# Patient Record
Sex: Female | Born: 1935 | Race: White | Hispanic: No | Smoking: Never smoker
Health system: Southern US, Community
[De-identification: ages and names within clinical notes are randomized; demographics above are authoritative.]

## PROBLEM LIST (undated history)

## (undated) DIAGNOSIS — F419 Anxiety disorder, unspecified: Secondary | ICD-10-CM

## (undated) DIAGNOSIS — I2510 Atherosclerotic heart disease of native coronary artery without angina pectoris: Secondary | ICD-10-CM

## (undated) DIAGNOSIS — E785 Hyperlipidemia, unspecified: Secondary | ICD-10-CM

## (undated) DIAGNOSIS — D649 Anemia, unspecified: Secondary | ICD-10-CM

## (undated) DIAGNOSIS — I447 Left bundle-branch block, unspecified: Secondary | ICD-10-CM

## (undated) DIAGNOSIS — R55 Syncope and collapse: Secondary | ICD-10-CM

## (undated) DIAGNOSIS — K5792 Diverticulitis of intestine, part unspecified, without perforation or abscess without bleeding: Secondary | ICD-10-CM

## (undated) DIAGNOSIS — F32A Depression, unspecified: Secondary | ICD-10-CM

## (undated) DIAGNOSIS — Z950 Presence of cardiac pacemaker: Secondary | ICD-10-CM

## (undated) DIAGNOSIS — I5181 Takotsubo syndrome: Secondary | ICD-10-CM

## (undated) DIAGNOSIS — I441 Atrioventricular block, second degree: Secondary | ICD-10-CM

## (undated) DIAGNOSIS — I10 Essential (primary) hypertension: Secondary | ICD-10-CM

## (undated) DIAGNOSIS — F329 Major depressive disorder, single episode, unspecified: Secondary | ICD-10-CM

## (undated) HISTORY — DX: Hyperlipidemia, unspecified: E78.5

## (undated) HISTORY — PX: PACEMAKER INSERTION: SHX728

## (undated) HISTORY — DX: Atrioventricular block, second degree: I44.1

## (undated) HISTORY — PX: CHOLECYSTECTOMY: SHX55

## (undated) HISTORY — DX: Essential (primary) hypertension: I10

## (undated) HISTORY — PX: TUBAL LIGATION: SHX77

## (undated) HISTORY — DX: Depression, unspecified: F32.A

## (undated) HISTORY — DX: Takotsubo syndrome: I51.81

## (undated) HISTORY — DX: Atherosclerotic heart disease of native coronary artery without angina pectoris: I25.10

## (undated) HISTORY — DX: Syncope and collapse: R55

## (undated) HISTORY — PX: OTHER SURGICAL HISTORY: SHX169

## (undated) HISTORY — DX: Left bundle-branch block, unspecified: I44.7

## (undated) HISTORY — DX: Anxiety disorder, unspecified: F41.9

## (undated) HISTORY — DX: Presence of cardiac pacemaker: Z95.0

## (undated) HISTORY — DX: Diverticulitis of intestine, part unspecified, without perforation or abscess without bleeding: K57.92

## (undated) HISTORY — PX: INGUINAL HERNIA REPAIR: SHX194

## (undated) HISTORY — DX: Anemia, unspecified: D64.9

## (undated) HISTORY — PX: CARPAL TUNNEL RELEASE: SHX101

## (undated) HISTORY — DX: Major depressive disorder, single episode, unspecified: F32.9

## (undated) HISTORY — PX: SPLENECTOMY: SUR1306

---

## 1998-02-16 ENCOUNTER — Other Ambulatory Visit: Admission: RE | Admit: 1998-02-16 | Discharge: 1998-02-16 | Payer: Self-pay | Admitting: Internal Medicine

## 1998-04-09 ENCOUNTER — Ambulatory Visit (HOSPITAL_COMMUNITY): Admission: RE | Admit: 1998-04-09 | Discharge: 1998-04-09 | Payer: Self-pay | Admitting: Internal Medicine

## 2000-12-01 ENCOUNTER — Other Ambulatory Visit: Admission: RE | Admit: 2000-12-01 | Discharge: 2000-12-01 | Payer: Self-pay | Admitting: Internal Medicine

## 2002-05-27 ENCOUNTER — Encounter: Payer: Self-pay | Admitting: Internal Medicine

## 2002-05-27 ENCOUNTER — Encounter: Admission: RE | Admit: 2002-05-27 | Discharge: 2002-05-27 | Payer: Self-pay | Admitting: Obstetrics and Gynecology

## 2003-06-12 ENCOUNTER — Ambulatory Visit (HOSPITAL_COMMUNITY): Admission: RE | Admit: 2003-06-12 | Discharge: 2003-06-12 | Payer: Self-pay | Admitting: *Deleted

## 2003-11-09 ENCOUNTER — Other Ambulatory Visit: Admission: RE | Admit: 2003-11-09 | Discharge: 2003-11-09 | Payer: Self-pay | Admitting: Obstetrics and Gynecology

## 2003-11-15 ENCOUNTER — Encounter: Admission: RE | Admit: 2003-11-15 | Discharge: 2003-11-15 | Payer: Self-pay | Admitting: Obstetrics and Gynecology

## 2005-05-06 ENCOUNTER — Encounter: Admission: RE | Admit: 2005-05-06 | Discharge: 2005-05-06 | Payer: Self-pay | Admitting: Internal Medicine

## 2005-05-07 ENCOUNTER — Encounter: Admission: RE | Admit: 2005-05-07 | Discharge: 2005-05-07 | Payer: Self-pay | Admitting: Internal Medicine

## 2005-06-30 ENCOUNTER — Encounter: Payer: Self-pay | Admitting: *Deleted

## 2005-07-01 ENCOUNTER — Ambulatory Visit: Payer: Self-pay | Admitting: Cardiology

## 2005-07-01 ENCOUNTER — Encounter: Payer: Self-pay | Admitting: Cardiology

## 2005-07-01 ENCOUNTER — Inpatient Hospital Stay (HOSPITAL_COMMUNITY): Admission: AD | Admit: 2005-07-01 | Discharge: 2005-07-02 | Payer: Self-pay | Admitting: Internal Medicine

## 2005-07-09 ENCOUNTER — Inpatient Hospital Stay (HOSPITAL_COMMUNITY): Admission: EM | Admit: 2005-07-09 | Discharge: 2005-07-12 | Payer: Self-pay | Admitting: Emergency Medicine

## 2005-11-23 ENCOUNTER — Emergency Department (HOSPITAL_COMMUNITY): Admission: EM | Admit: 2005-11-23 | Discharge: 2005-11-23 | Payer: Self-pay | Admitting: Emergency Medicine

## 2005-12-14 ENCOUNTER — Inpatient Hospital Stay (HOSPITAL_COMMUNITY): Admission: EM | Admit: 2005-12-14 | Discharge: 2005-12-15 | Payer: Self-pay | Admitting: Emergency Medicine

## 2006-05-21 ENCOUNTER — Encounter: Admission: RE | Admit: 2006-05-21 | Discharge: 2006-05-21 | Payer: Self-pay | Admitting: Internal Medicine

## 2007-04-10 IMAGING — US US TRANSVAGINAL NON-OB
1 series · 14 of 25 positions shown · non-contrast
Comparison: None.

CLINICAL DATA: Left lower quadrant pain.  
TRANSABDOMINAL AND TRANSVAGINAL PELVIC ULTRASOUND:
TECHNIQUE: Both transabdominal and transvaginal ultrasound examinations of the pelvis were performed including evaluation of the uterus, ovaries, adnexal regions, and pelvic cul-de-sac.

[Series 1: unknown · 0.20mm/px · 14 of 47 slices shown]
[im 1/47]
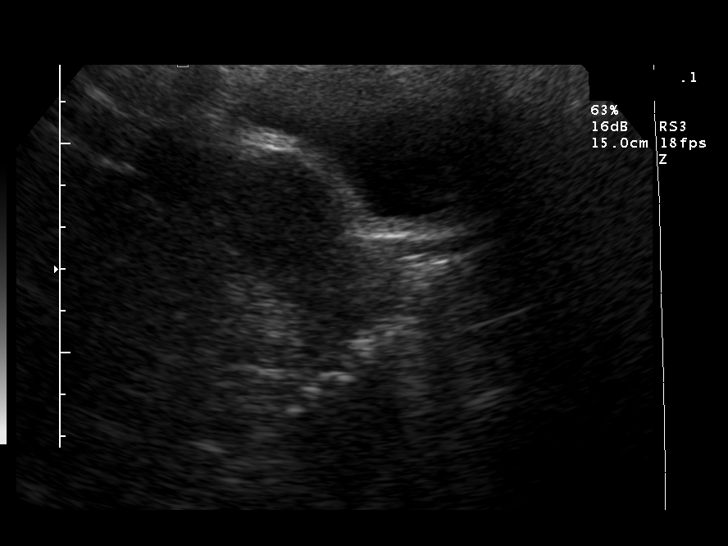
[im 4/47]
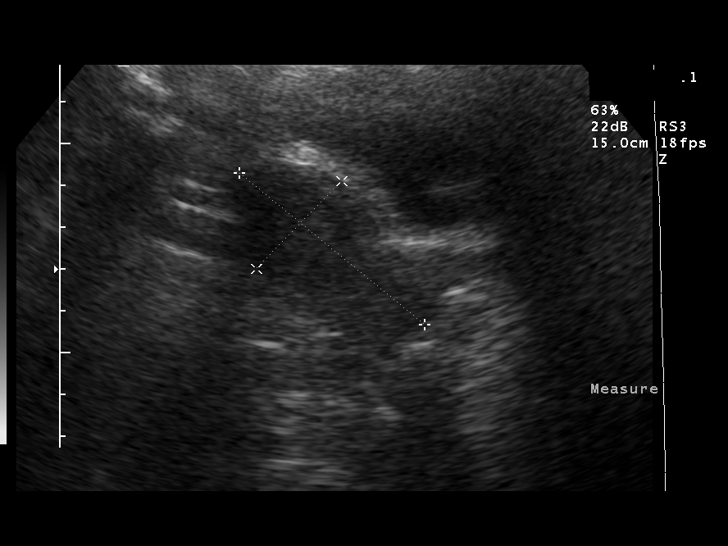
[im 8/47]
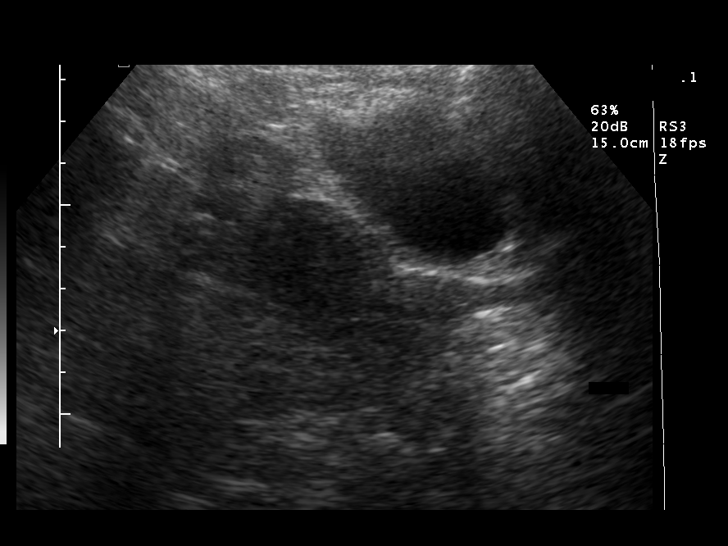
[im 12/47]
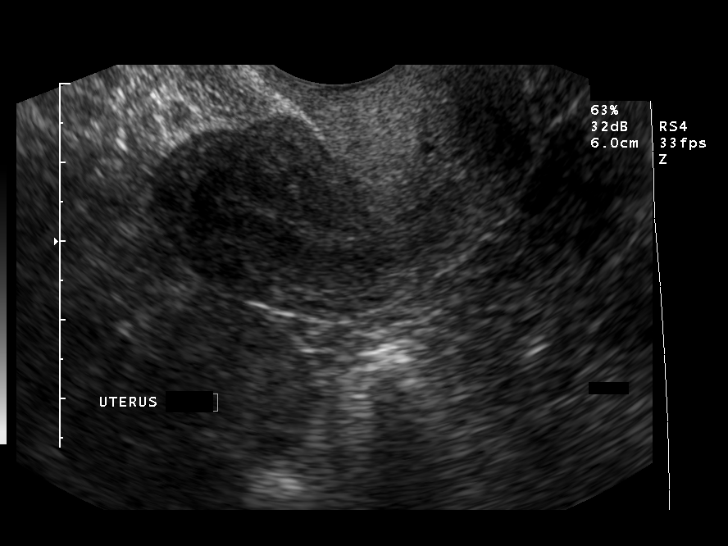
[im 16/47]
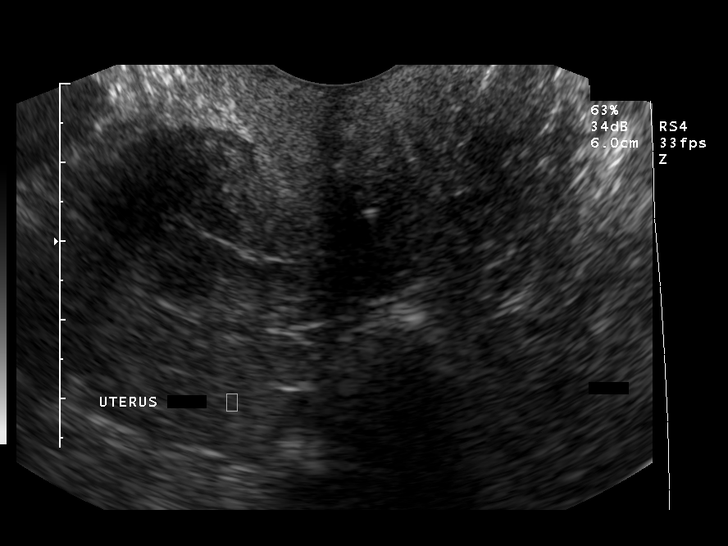
[im 18/47]
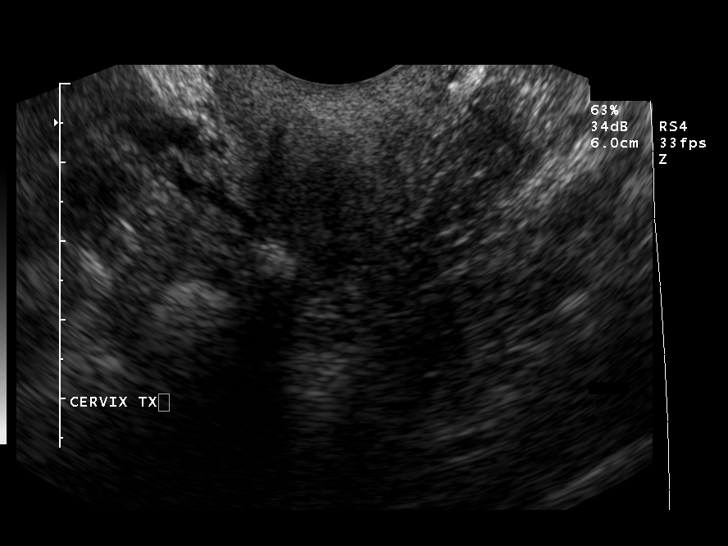
[im 22/47]
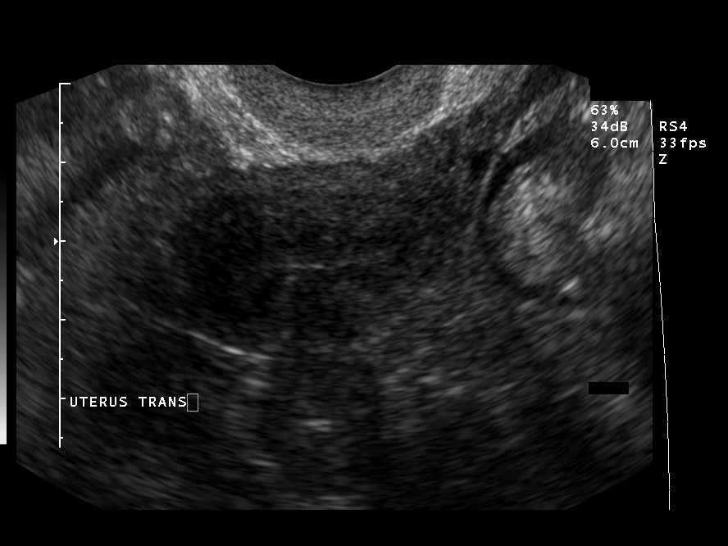
[im 25/47]
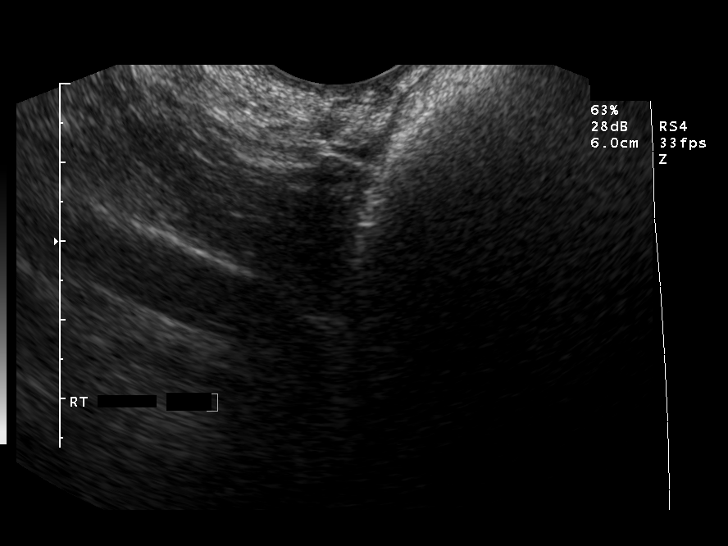
[im 29/47]
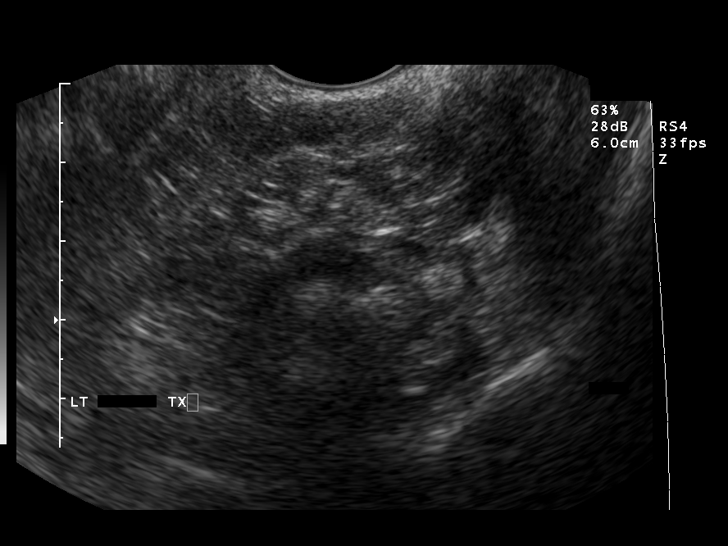
[im 31/47]
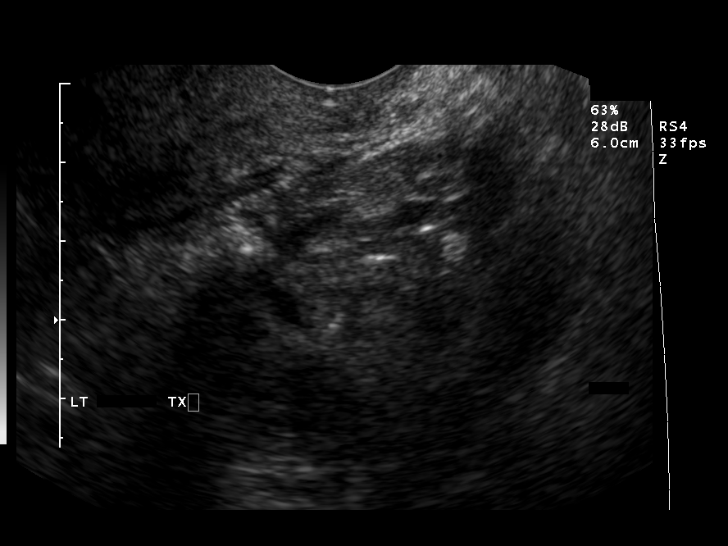
[im 35/47]
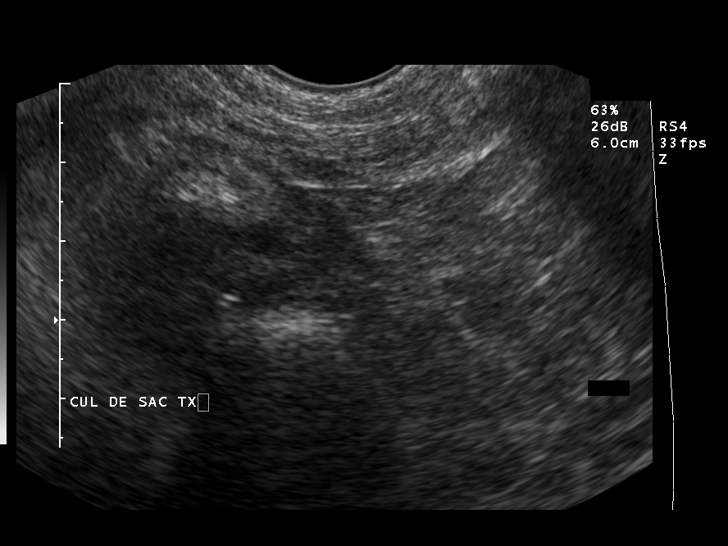
[im 39/47]
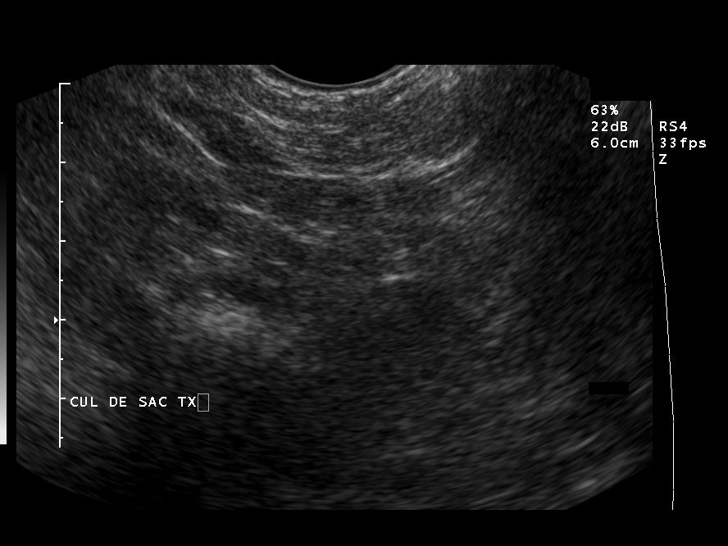
[im 43/47]
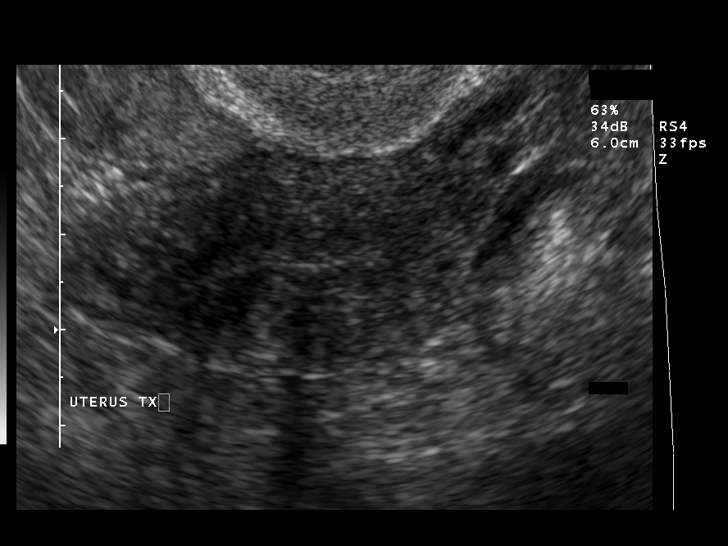
[im 47/47]
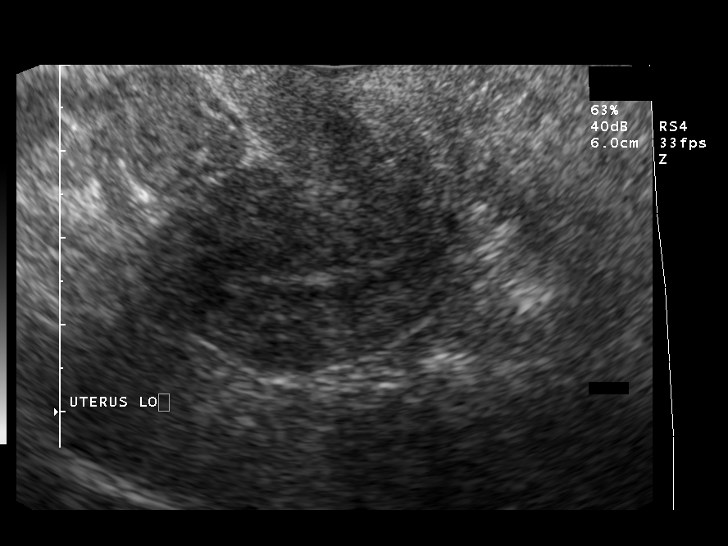

[14 of 25 positions shown; findings below may reference images not displayed]

FINDINGS: The uterus is relatively small which is normal for age.  It measures 5.7 x 2.9 x 4.0 cm.  There are no lesions of the myometrium demonstrated.   Endometrium measures about 4 mm in thickness and is homogeneous.  Neither ovary is visible transabdominally or transvaginally.  This is not uncommon in females of this age, when the ovaries are atrophied.  No adnexal masses or fluid collections.  No free fluid.
IMPRESSION: No masses or other pathological findings ? neither ovary visualized transabdominally or transvaginally, in this 78-year-old female.

## 2007-05-25 ENCOUNTER — Encounter: Admission: RE | Admit: 2007-05-25 | Discharge: 2007-05-25 | Payer: Self-pay | Admitting: Internal Medicine

## 2007-12-17 ENCOUNTER — Emergency Department (HOSPITAL_COMMUNITY): Admission: EM | Admit: 2007-12-17 | Discharge: 2007-12-17 | Payer: Self-pay | Admitting: Emergency Medicine

## 2008-05-29 ENCOUNTER — Encounter: Admission: RE | Admit: 2008-05-29 | Discharge: 2008-05-29 | Payer: Self-pay | Admitting: Internal Medicine

## 2008-08-22 ENCOUNTER — Ambulatory Visit (HOSPITAL_COMMUNITY): Admission: RE | Admit: 2008-08-22 | Discharge: 2008-08-22 | Payer: Self-pay | Admitting: *Deleted

## 2008-10-06 ENCOUNTER — Emergency Department (HOSPITAL_COMMUNITY): Admission: EM | Admit: 2008-10-06 | Discharge: 2008-10-06 | Payer: Self-pay | Admitting: Emergency Medicine

## 2008-10-09 ENCOUNTER — Emergency Department (HOSPITAL_COMMUNITY): Admission: EM | Admit: 2008-10-09 | Discharge: 2008-10-09 | Payer: Self-pay | Admitting: Emergency Medicine

## 2009-01-24 ENCOUNTER — Encounter: Admission: RE | Admit: 2009-01-24 | Discharge: 2009-01-24 | Payer: Self-pay | Admitting: Orthopedic Surgery

## 2009-01-25 ENCOUNTER — Ambulatory Visit (HOSPITAL_BASED_OUTPATIENT_CLINIC_OR_DEPARTMENT_OTHER): Admission: RE | Admit: 2009-01-25 | Discharge: 2009-01-25 | Payer: Self-pay | Admitting: Orthopedic Surgery

## 2009-05-30 ENCOUNTER — Encounter: Admission: RE | Admit: 2009-05-30 | Discharge: 2009-05-30 | Payer: Self-pay | Admitting: Internal Medicine

## 2010-06-03 ENCOUNTER — Encounter: Admission: RE | Admit: 2010-06-03 | Discharge: 2010-06-03 | Payer: Self-pay | Admitting: Internal Medicine

## 2010-08-22 ENCOUNTER — Encounter
Admission: RE | Admit: 2010-08-22 | Discharge: 2010-08-22 | Payer: Self-pay | Source: Home / Self Care | Attending: Internal Medicine | Admitting: Internal Medicine

## 2010-09-29 ENCOUNTER — Encounter: Payer: Self-pay | Admitting: Internal Medicine

## 2010-12-17 LAB — BASIC METABOLIC PANEL
CO2: 28 mEq/L (ref 19–32)
Calcium: 9.4 mg/dL (ref 8.4–10.5)
Chloride: 103 mEq/L (ref 96–112)
GFR calc Af Amer: >60 mL/min (ref >60)
Sodium: 137 mEq/L (ref 135–145)

## 2011-01-21 NOTE — Op Note (Signed)
[name], [name]          ACCOUNT NO.:  [id number]   MEDICAL RECORD NO.:  [id number]          PATIENT TYPE:  AMB   LOCATION:  ENDO                         FACILITY:  [facility]   PHYSICIAN:  Georgiana Spinner, M.D.    DATE OF BIRTH:  18-Dec-1926   DATE OF PROCEDURE:  08/22/2008  DATE OF DISCHARGE:                               OPERATIVE REPORT   This Dr. working gas colon until the ENT P L [facility]   PROCEDURE:  Flexible sigmoidoscopy.   INDICATIONS:  Rectal bleeding.   ANESTHESIA:  Fentanyl 75 mcg, Versed 7.5 mg.   PROCEDURE:  With the patient mildly sedated in the left lateral  decubitus position the Pentax videoscopic pediatric colonoscope was  inserted into the rectum and passed under direct vision to about the  sigmoid colon at which point we encountered significant diverticulosis  and could advance the scope no further.  Photographs were taken.  The  endoscope was then slowly withdrawn, taking circumferential views of  colonic mucosa, but there was an area I could not see in this area due  to the tightness of the lumen and the amount of diverticula.  We then  withdrew all the way to the rectum which appeared normal on direct and  showed hemorrhoids on retroflexed view.  The endoscope was straightened  and withdrawn.  The patient's vital signs, pulse oximeter remained  stable.  The patient tolerated procedure well without apparent  complication.   FINDINGS:  1. Significant diverticulosis of the sigmoid colon.  2. Internal hemorrhoids, small.   PLAN:  Get barium enema for further evaluation to rule out gross  lesions.   OF NOTE:  Prep was slightly suboptimal at this point, probably due to  the diverticulosis, and we will have patient follow up with me as an  outpatient.           ______________________________  Georgiana Spinner, M.D.     GMO/MEDQ  D:  08/22/2008  T:  08/23/2008  Job:  [id number]

## 2011-01-21 NOTE — Op Note (Signed)
NAMESAWYER, KAHAN          ACCOUNT NO.:  [id number]   MEDICAL RECORD NO.:  [id number]          PATIENT TYPE:  AMB   LOCATION:  DSC                          FACILITY:  [facility]   PHYSICIAN:  Katy Fitch. Sypher, M.D. 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   OPERATION:  Release of left transverse carpal ligament.   OPERATING SURGEON:  Katy Fitch. Sypher, MD   ASSISTANT:  Marveen Reeks Dasnoit, PA   ANESTHESIA:  General by LMA.   SUPERVISING ANESTHESIOLOGIST:  Quita Skye. Krista Blue, MD   INDICATIONS:  [name] is a 77 year old woman referred through  the courtesy of Dr. Nicholos Johns for evaluation and management of hand  numbness.  She was noted to have diminished sensibility in the thumb,  index, and long fingers of the left hand.  Clinical examination  suggested carpal tunnel syndrome and electrodiagnostic studies confirmed  carpal tunnel syndrome.   Due to a failure to respond to nonoperative measures, she is brought to  the operating room at this time for release of her left transverse  carpal ligament.   PROCEDURE:  [name] was brought to the operating room and  placed in a supine position up on the operating table.   Following the induction of general anesthesia by LMA technique, the left  arm was prepped with Betadine soap and solution and sterilely draped.  A  pneumatic tourniquet was applied to the proximal left brachium.  Following exsanguination of the left arm with Esmarch bandage, the  arterial tourniquet was inflated to 230 mmHg.  The procedure commenced  with a short incision in line of the ring finger and the palm.  Subcutaneous tissues were carefully divided between the  palmar fascia.  This was split longitudinally to reveal the common sensory branch of the  median nerve.  These were followed back to the transverse carpal  ligament where the nerve was gently isolated with the aid of a Dealer.  The transverse carpal ligament was then gently released  with scissors extending into the distal forearm.  This widely opened the  carpal canal.  No mass or other predicaments were noted other than  fibrosis of the ulnar bursa.   The wound was repaired with intradermal 3-0 Prolene suture.  A Steri-  Strip was applied followed by a volar plaster splint maintaining the  wrist in 5 degrees dorsiflexion.   For aftercare, [name] was provided a prescription for Percocet 5 mg  1 p.o. q.4-6 hours p.r.n. pain, 20 tablets without refill.   We will see her for followup in our office in 1 week or sooner p.r.n.  problems.      Katy Fitch Sypher, M.D.  Electronically Signed     RVS/MEDQ  D:  01/25/2009  T:  01/26/2009  Job:  [id number]   cc:   [name], M.D.

## 2011-01-24 NOTE — H&P (Signed)
[name], [name]          ACCOUNT NO.:  [id number]   MEDICAL RECORD NO.:  [id number]          PATIENT TYPE:  EMS   LOCATION:  ED                           FACILITY:  [facility]   PHYSICIAN:  Isidor Holts, M.D.  DATE OF BIRTH:  [birth year]   DATE OF ADMISSION:  06/30/2005  DATE OF DISCHARGE:                                HISTORY & PHYSICAL   PMD:  [name], M.D.   CHIEF COMPLAINT:  Black outs.   HISTORY OF PRESENT ILLNESS:  This is a 77 year old female.  She was at work  today at [employer].  At about 4:15 p.m. while standing, suddenly felt more  nauseated and dizzy.  She felt lightheaded.  Denies vertigo or tinnitus.  Denies chest pain or palpitations.  Decided to head for the break room.  On  her way there, appears to have had transient visual loss, because she almost  walked into a receptacle containing flowers.  She managed to avoid this  narrowly, and then blacked out, falling backwards, banging the back of her  head.  She is not sure how long she was out for, but came to on the floor,  surrounded by people.  She denies confusion, disorientation, slurred speech,  or limb weakness.  She tried to get up, but felt too dizzy to do so.  EMS  was called.  No history of previous episodes.  No tongue-biting or  incontinence.   PAST MEDICAL HISTORY:  1.  Hypertension.  2.  Dyslipidemia.  3.  Anxiety.  4.  Status post cholecystectomy in 1965.  5.  Status post tubal ligation in 1961.  6.  Status post appendectomy in 1937.  7.  Recurrent anemia requiring blood transfusions.  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  8.  Sigmoid diverticulosis/internal hemorrhoids, confirmed by colonoscopy on      June 11, 2005 by Dr. Virginia Rochester.   MEDICATIONS:  1.  Hyzaar 50/12.5 1 p.o. daily.  2.  Diazepam 10 mg p.o. b.i.d.  3.  Trazodone 50 mg p.o. p.r.n. q.h.s.  4.  Lipitor 5 mg p.o. on alternating days.  5.  Aspirin 1 p.o. daily.  6.  Calcium 600 mg plus vitamin D  1 p.o. b.i.d.  7.  Fish oil concentrate 4 pills p.o. daily.  8.  Senokot 2 pills q.h.s.  9.  Centrum Silver multivitamin 1 p.o. daily.   ALLERGIES:  CODEINE.   REVIEW OF SYSTEMS:  As per H&P and chief complaint, otherwise negative.   FAMILY/SOCIAL HISTORY:  Patient is widowed.  Her husband passed away in  2005-02-28.  She lives alone now.  Nonsmoker and nondrinker.  No history of  drug abuse.  Has one son and one daughter, who are alive and well.  She had  three brothers.  One died of complications of multiple sclerosis and  diabetes mellitus.  Another died of lung cancer.  She had a sister who died  at age 51 months.  Her mother died at age 8 years, status post PE.  She was  also found to have pancreatic cancer.  Her  father died at 63 years.  He had  heart disease.   PHYSICAL EXAMINATION:  VITALS:  Temperature 100, pulse 97 per minute,  respiratory rate 22, BP 128/59 mmHg.  Pulse oximetry 97% on room air.  GENERAL:  Patient appears alert and oriented.  Not in obvious acute  distress.  Not short of breath at rest.  Communicative.  HEENT:  No clinical pallor or jaundice.  No conjunctival injection.  Throat  appears clear.  A subcutaneous hematoma is noted in the occipital region,  approximately 4 x 3 cm.  Soft, boggy.  No open injuries.  NECK:  Supple.  JVP not seen.  No palpable lymphadenopathy.  No palpable  goiter.  No carotid bruits.  LUNGS:  Clinically clear to auscultation.  No wheezes or crackles.  HEART:  S1 and S2 heard, normal, regular.  No murmurs.  ABDOMEN:  Old surgical scars are noted.  Abdomen is full, soft and  nontender.  There is no palpable organomegaly.  No palpable masses.  Normal  bowel sounds.  EXTREMITIES:  Normal.  No pitting edema.  Palpable peripheral pulses.  MUSCULOSKELETAL:  Quite unremarkable.  CENTRAL NERVOUS SYSTEM:  No focal neurological deficits.   INVESTIGATIONS:  CBC:  WBC 17, neutrophils 80%, hemoglobin 10.9, hematocrit  32.3, platelets 362.   MCV 95.4.  Electrolytes:  Sodium 135, potassium 3.4,  chloride 101, CO2 27, BUN 10, creatinine 0.8, glucose 138.   Rhythm strip shows sinus rhythm at approximately 90 per minute.   Head CT scan dated on June 30, 2005 shows mild small vessel disease.  No  acute abnormalities.   CT of the cervical spine shows normal alignment.  No acute fracture.   ASSESSMENT/PLAN:  1.  Syncopal episode:  Query etiology.  Admit patient for telemetric      monitoring.  EKG shows sinus rhythm, but we need to rule out paroxysmal      tachy-arrhythmia.  Also the possibility of a transient ischemic attack.      We shall do a brain MRI.  Carotid and vertebral artery duplex scans.  A      2D echocardiogram.  Continue aspirin at a dose of 325 mg p.o. daily.  Do      an EEG to rule out possible seizure disorder.  For completeness, cycle      cardiac enzymes.  Neuro checks will be carried out at q.4h. intervals.      Fall precautions and seizure precautions will be instituted.   1.  History of dyslipidemia:  Check TSH and lipid profile.  Meanwhile,      continue Lipitor.   1.  Mild normocytic anemia:  Will check B12, folate, iron, ferritin, and      TIBC levels.   1.  History of hypertension:  Blood pressure appears normal at present.  We      will check orthostatics and hold antihypertensive medications for now.      Patient does not appear dehydrated.   1.  Elevated white blood cell count with neutrophilia:  No focal signs of      infection at present.  We shall arrange a septic workup with urinalysis,      blood cultures, and chest x-ray.   1.  History of anxiety:  Continue diazepam in pre-admission dosages.  Further management will depend on clinical course.      Isidor Holts, M.D.  Electronically Signed     CO/MEDQ  D:  06/30/2005  T:  06/30/2005  Job:  [id number]   cc:   [name], M.D.  Fax: [phone]

## 2011-01-24 NOTE — Op Note (Signed)
   [name], [name]                    ACCOUNT NO.:  [id number]   MEDICAL RECORD NO.:  [id number]                   PATIENT TYPE:  AMB   LOCATION:  ENDO                                 FACILITY:  [facility]   PHYSICIAN:  Georgiana Spinner, M.D.                 DATE OF BIRTH:  [birth year]   DATE OF PROCEDURE:  06/12/2003  DATE OF DISCHARGE:                                 OPERATIVE REPORT   PROCEDURE:  Colonoscopy.   INDICATIONS:  Colon polyps.   ANESTHESIA:  Demerol 80 mg, Versed 8 mg.   DESCRIPTION OF PROCEDURE:  With the patient mildly sedated in the left  lateral decubitus position, the Olympus videoscopic colonoscope was inserted  in the rectum and passed under direct vision into the cecum, identified by  ileocecal valve and appendiceal orifice, both of which were photographed.  From this point the colonoscope was slowly withdrawn, taking circumferential  views of the colonic mucosa, stopping to photograph diverticula along the  way in the sigmoid colon until we reached the rectum, which appeared normal  on direct and showed hemorrhoids on retroflexed view.  The scope was  straightened and withdrawn.  The patient's vital signs and pulse oximetry  remained stable.  The patient tolerated the procedure well without apparent  complications.   FINDINGS:  1. Internal hemorrhoids.  2. Diverticulosis of the sigmoid colon.  3. Otherwise unremarkable colonoscopic examination.   PLAN:  Consider repeat examination in five years.                                                Georgiana Spinner, M.D.    GMO/MEDQ  D:  06/12/2003  T:  06/12/2003  Job:  [id number]

## 2011-01-24 NOTE — H&P (Signed)
[name], [name]:  [id number]   MEDICAL RECORD NO.:  [id number]          PATIENT TYPE:  INP   LOCATION:  0104                         [facility]ACILITY:  [facility]   PHYSICIAN:  Elliot Cousin, M.D.    DATE OF BIRTH:  [birth year]   DATE OF ADMISSION:  07/09/2005  DATE OF DISCHARGE:                                HISTORY & PHYSICAL   PRIMARY CARE PHYSICIAN:  [name], M.D.   GASTROENTEROLOGIST:  Georgiana Spinner, M.D.   CHIEF COMPLAINT:  Rectal bleeding.   HISTORY OF PRESENT ILLNESS:  The patient is a 77 year old lady with a past  medical history significant for sigmoid diverticulosis and internal  hemorrhoids per colonoscopy in October of 2004, recent hospitalization 1  week ago for syncope, and chronic anemia status post splenectomy in 1954,  who presents to the emergency department with a chief complaint of rectal  bleeding.  Last night, at approximately 8:30 p.m., the patient had a loose  bowel movement.  When she wiped, she noticed blood on the toilet tissue.  She then looked into the toilet and saw blood-tinged water.  After the first  bowel movement, approximately 30 minutes later she had another almost  exactly the same.  The patient denies abdominal pain, nausea, and vomiting.  She denies painful urination.  She denies seeing blood in her urine  specifically.  She does have chronic constipation and takes a laxative on a  daily basis.  She occasionally strains; however, she has not had any recent  bouts of constipation and straining over the past few days.  The patient has  a history of rectal bleeding in the distant past.  She underwent a  colonoscopy at that time, and was told she had hemorrhoids.  She underwent  another colonoscopy in 2004 by Dr. Virginia Rochester secondary to lower abdominal pain.  A  colonoscopy at that time revealed diverticulosis and internal hemorrhoids.  The patient has not had rectal bleeding in several years.  Over the past 24-  48 hours,  she has had no complaints of headache, dizziness, lightheadedness,  or passing out spells.  The patient does take chronic iron supplementation  secondary to a history of chronic anemia.   When the patient was evaluated by the emergency department physician, she  was noted to be hemodynamically stable.  Her hemoglobin was 10.9.  The  rectal exam performed by Dr. Deretha Emory revealed a scant amount of stool in  the rectal vault which was blood tinged with red blood.  The patient will be  admitted for further evaluation and management.   PAST MEDICAL HISTORY:  1.  Syncope on June 30, 2005.  Work-up essentially negative, with the      exception of mild bilateral carotid artery stenosis.  The syncope was      felt to be secondary to orthostatic changes.  2.  Forty to 60% bilateral carotid artery stenosis per carotid Doppler study      July 01, 2005.  3.  History of anemia dating back to 30.  The patient underwent a      splenectomy in 1954 at that time.  Question history of hemolytic anemia.  4.  The patient is up to date on her Pneumovax, which was received 2 years      ago.  She also received a flu vaccine last week.  5.  Hypertension.  6.  Hyperlipidemia.  7.  Sigmoid diverticulosis and internal hemorrhoids per colonoscopy October      2004 per Dr. Virginia Rochester.  8.  History of lower GI bleed prior to 2004.  Per the patient's report, the      colonoscopy revealed only hemorrhoids.  9.  Depression.  10. Anxiety.  11. Status post cholecystectomy in 1965.  12. Status post bilateral tubal ligation in 1961.  13. Status post appendectomy in 1937.   MEDICATIONS:  1.  Hyzaar discontinued last week during the hospitalization.  2.  Diazepam 2 mg b.i.d.  3.  Lipitor 10 mg half a tablet nightly.  4.  Aspirin 81 mg daily.  5.  Calcium with vitamin D one tablet b.i.d.  6.  Fish oil capsules 4 daily.  7.  Multivitamin with iron once daily.  8.  Paxil 40 mg daily.  9.  Ferrous sulfate 325 mg  daily.  10. Ambien 5 mg nightly p.r.n.   ALLERGIES:  The patient has an allergy to CODEINE.   SOCIAL HISTORY:  The patient is widowed.  Her husband recently passed away  in 05-Mar-2005.  She has 2 children.  She works part time at a department  store.  No history of tobacco, alcohol, or drug use.  She is fairly  independent.   FAMILY HISTORY:  Her mother died at 25 years of age secondary to pulmonary  embolism.  Her mother also had pancreatic cancer.  Her father died at 37  years of age secondary to heart disease.  She had 1 brother who died of  complications from multiple sclerosis and diabetes mellitus.  She had  another brother who died of lung cancer.  She had 1 sister who died at the  age of 31 months old.   REVIEW OF SYSTEMS:  This patient's review of systems is positive for  anxiety, sadness from her husband's recent death.  No suicide ideation.  Otherwise, review of systems is negative.   PHYSICAL EXAMINATION:  VITAL SIGNS:  Temperature 97.9, blood pressure  141/79, pulse 76, oxygen saturation 97% on room air.  GENERAL:  The patient is a pleasant 77 year old-Caucasian lady who is  currently lying in bed in no acute distress.  HEENT:  Head is normocephalic and non-traumatic.  Pupils equal, round and  reactive to light.  Extraocular movements are intact.  Conjunctivae are  clear.  Sclerae are white.  Tympanic membranes are clear bilaterally.  Nasal  mucosa is moist.  No sinus tenderness.  Oropharynx reveals good dentition.  Mucous membranes are moist.  No posterior exudates or erythema.  NECK:  Supple.  No adenopathy, no thyromegaly, no bruit, no JVD.  LUNGS:  Clear to auscultation bilaterally.  HEART:  S1 and S2 with no murmurs, rubs, or gallops.  ABDOMEN:  Positive bowel sounds, soft, nontender, nondistended.  No  hepatosplenomegaly.  No masses palpated.  Well-healed left upper quadrant  scar. RECTAL:  Per the emergency department physician revealed a scant amount of   stool which was blood-tinged with red blood.  GU:  Deferred.  EXTREMITIES:  Pedal pulses 2+ bilaterally.  No pretibial edema.  No pedal  edema.  Range of motion of her extremities is within normal limits.  No  acute  joint abnormalities.  NEUROLOGIC:  The patient is alert and oriented x3.  Cranial nerves II-XII  are intact.  Strength is 5/5 throughout.  Sensation is intact throughout.   ADMISSION LABORATORIES:  WBC 19.6, RBC 3.33, hemoglobin 10.9, hematocrit  32.2, MCV 96.6, platelets 454, PT 13.1, INR 1.0, PTT 30.  Sodium 138,  potassium 3.8, chloride 106, CO2 of 24, glucose 121, BUN 12, creatinine 0.8,  calcium 9.2.   ASSESSMENT:  1.  Gastrointestinal bleed.  More than likely, the gastrointestinal bleed is      in the lower GI tract.  The patient has known internal hemorrhoids and      diverticulosis.  She has a history of rectal bleeding in the distant      past, felt to be secondary to hemorrhoids per her account.  The bleeding      has currently stopped.  2.  Normocytic anemia.  The patient has a history of chronic anemia dating      back to 42.  She underwent a splenectomy because of the anemia      apparently.  Question history of hemolytic anemia at that time.  Her      hemoglobin is mildly low at 10.9.  As a comparison last week, her      hemoglobin was 10.9.  During the hospitalization last week, iron studies      revealed a vitamin B-12 of 753, folate greater than 20, ferritin 247,      and total iron of 27.  The patient is currently being treated with iron      supplementation.  The patient has received a liter and a half of normal      saline in the emergency department.  Would expect the hemoglobin to      fall, in part secondary to hemodilution.  3.  Leukocytosis.  The patient's white blood cell count is 19.6 on      admission.  As a comparison last week, her white blood cell count was      17.0.  The patient is currently afebrile.  No evidence of acute      infection  per history and physical.  The leukocytosis may be reactive      secondary to anemia and/or reactive secondary to history of splenectomy.      The patient may warrant an outpatient hematology consultation if her      white blood cell count continues to increase.  Of note, the patient is      up to date on her Pneumovax and fluid vaccine.  4.  Hypertension.  The patient's blood pressure is mildly elevated.  Hyzaar      was discontinued during the previous hospitalization secondary to      orthostatic hypotension.  Her blood pressures will need to be followed      closely.  Her blood pressures may warrant restarting an antihypertensive      medication.   PLAN:  1.  The patient will be admitted for 24-48 hours for further evaluation and      management.  2.  Will continue to monitor the patient's CBC daily.  3.  The patient has been typed and screened 1 unit of packed red blood      cells.  Will hold for now. 4.  Consult gastroenterologist, Dr. Virginia Rochester.  5.  Hold aspirin for now.  6.  Start Protonix prophylactically and empirically at 40 mg daily.  7.  Continue iron supplementation; however,  increase the ferrous sulfate to      b.i.d.  8.  Continue multivitamin with iron, as well.  9.  Gentle IV fluids.  10. Monitor and record and guaiac all stools.      Elliot Cousin, M.D.  Electronically Signed     DF/MEDQ  D:  07/09/2005  T:  07/09/2005  Job:  [id number]   cc:   [name], M.D.  Fax: [phone]   Georgiana Spinner, M.D.  Fax: [phone]

## 2011-01-24 NOTE — Discharge Summary (Signed)
NAMEADELITA, HONE          ACCOUNT NO.:  [id number]   MEDICAL RECORD NO.:  [id number]          PATIENT TYPE:  INP   LOCATION:  1417                         FACILITY:  [facility]   PHYSICIAN:  Marcie Mowers, M.D.DATE OF BIRTH:  08/15/1927   DATE OF ADMISSION:  07/08/2005  DATE OF DISCHARGE:  07/12/2005                                 DISCHARGE SUMMARY   ADDENDUM:  The patient is being discharged to home.  Her hemoglobin and  hematocrit are stable and to date are hemoglobin of 9.7 and hematocrit of  29.3.  The patient is being discharged on the following medications.   DISCHARGE MEDICATIONS:  She will resume her home mediations as follows:  1.  Valium 2 mg p.o. b.i.d.  2.  Lipitor 5 mg p.o. q.h.s.  3.  Calcium plus vitamin D one tablet p.o. b.i.d.  4.  Fish oil supplements daily.  5.  Multivitamin/Centrum one tablet daily.  6.  Paxil 40 mg p.o. daily.  7.  Ambien 5 mg p.o. q.h.s. p.r.n.  8.  Senokot two tablets p.o. q.h.s.  9.  Her aspirin is being held approximately for about 1 week.  10. Her iron supplements are being discontinued.   FOLLOW UP:  The patient has an appointment to see her primary care physician  on July 15, 2005, which she will keep.  She will call and make an  appointment to see Dr. Virginia Rochester within 1 week's time, preferably July 18, 2005.           ______________________________  Marcie Mowers, M.D.     PMJ/MEDQ  D:  07/12/2005  T:  07/13/2005  Job:  [id number]

## 2011-01-24 NOTE — Op Note (Signed)
[name], [name]          ACCOUNT NO.:  [id number]   MEDICAL RECORD NO.:  [id number]          PATIENT TYPE:  INP   LOCATION:  1610                         FACILITY:  [facility]   PHYSICIAN:  Sandria Bales. Ezzard Standing, M.D.  DATE OF BIRTH:  31-Oct-1926   DATE OF PROCEDURE:  12/14/2005  DATE OF DISCHARGE:                                 OPERATIVE REPORT   PREOPERATIVE DIAGNOSES:  Incarcerated left inguinal hernia.   POSTOPERATIVE DIAGNOSES:  Incarcerated direct left inguinal hernia.   PROCEDURE:  Open left inguinal herniorrhaphy with mesh repair with no  obvious bowel injury.   SURGEON:  Sandria Bales. Ezzard Standing, M.D.   FIRST ASSISTANT:  None.   ANESTHESIA:  General endotracheal.   ESTIMATED BLOOD LOSS:  Minimal.   INDICATIONS FOR PROCEDURE:  Ms. Kaus is a 77 year old white female who is  a patient of [name], M.D. and Huel Cote, M.D. who  presented with a mass in her left groin and signs and symptoms of partial  bowel obstruction. I think she has an incarcerated left inguinal hernia  which actually on physical exam feels like it may be a femoral hernia in the  left groin.   I discussed with the patient and her family the indications and potential  complications of surgery. The potential complications include but not  limited to, bleeding, infection, the need for bowel resection and the  possibility of an open laparotomy.   DESCRIPTION OF PROCEDURE:  The patient presents to the operating room, given  a general endotracheal anesthetic as supervised by Dr. Jill Side,  M.D. She was given 2 grams of cefoxitin  at the initiation of the procedure.  She was shaved, her abdomen was prepped with Betadine solution, she was  sterilely draped.   A left inguinal incision was made with sharp dissection and carried down  into the left groin area. I took my dissection down to the external oblique  fascia. Through her external ring was a 3-4 cm dot. Once I got her to sleep  in the Trendelenburg position, I was able to reduce this manually. I then  opened up the external oblique fascia, identified the round ligament  attached to the pubic tubercle which I took off the pubic tubercle. I then  found a defect in her inguinal floor which turned out to be a direct  inguinal hernia which had about a centimeter and a half defect.   Her bowel had been reduced, there was some edema of the wall in this area.   I divided the round ligament and ligated this with 2-0 chromic suture.   I then went up and found the peritoneum and made a peritoneal incision up  high in the left inguinal floor and lateral. It was not at the site of the  hernia defect. I got into the peritoneal cavity, I saw clear yellow fluid,  saw no bloody fluid and I looked at at least 2 loops of small bowel which  again looked viable without any evidence of ischemia. I thought a more  general laparotomy was not necessary at this time.   I then closed the  peritoneal defect with interrupted 3-0 Vicryl sutures. I  used a piece of Marlex mesh and sewed this into the inguinal floor. I sewed  it medially to the pubic tubercle, inferiorly to the shelving edge of the  inguinal ligament, superiorly to the transversalis fascia. I used a mesh  piece which was 3 x 6 which I cut down to maybe about 2.5 inches x 5 inches.   This lay flat on the inguinal floor. I then irrigated out the wound, closed  the external oblique fascia with interrupted 3-0 Vicryl sutures, irrigated  the wound again and the subcutaneous tissues closed with 3-0 Vicryl sutures  and the skin closed with a 5-0 Monocryl suture, painted the wound with  tincture or Benzoin and Steri-Strips.   Sponge and needle count were correct at the end of the case.  The patient  tolerated the procedure well and transferred to the recovery room in good  condition.      Sandria Bales. Ezzard Standing, M.D.  Electronically Signed     DHN/MEDQ  D:  12/14/2005  T:   12/15/2005  Job:  [id number]   cc:   [name], M.D.  Fax: [phone]   Huel Cote, M.D.  Fax: [phone]

## 2011-01-24 NOTE — Discharge Summary (Signed)
NAMEMARALEE, [name]          ACCOUNT NO.:  [id number]   MEDICAL RECORD NO.:  [id number]          PATIENT TYPE:  INP   LOCATION:  5502                         FACILITY:  [facility]   PHYSICIAN:  Lonia Blood, M.D.       DATE OF BIRTH:  1926-12-14   DATE OF ADMISSION:  07/01/2005  DATE OF DISCHARGE:  07/02/2005                                 DISCHARGE SUMMARY   PRIMARY CARE PHYSICIAN:  Dr. Nicholos Johns.   DISCHARGE DIAGNOSES:  1.  Syncope.  2.  Orthostatic hypotension.  3.  Dyslipidemia.  4.  Bilateral asymptomatic carotid artery stenosis at 40%.  5.  Anemia with studies consistent with iron-deficiency.  6.  Diverticulosis with recent colonoscopy on June 11, 2005.  7.  Status post cholecystectomy in 1965.  8.  Status post tubal ligation in 1961.  9.  Status post appendectomy in 1937.  10. Status post splenectomy.  11. Internal hemorrhoids.  12. Depression versus normal grief reaction secondary to the loss of her      husband five months ago.   DISCHARGE MEDICATIONS:  1.  Diazepam 2 mg p.o. b.i.d.  2.  Lipitor 5 mg p.o. at bedtime.  3.  Aspirin 81 mg p.o. daily.  4.  Calcium and vitamin D p.o. b.i.d.  5.  Fish oil four tablets daily.  6.  Multivitamins by mouth daily.  7.  Paxil 40 mg p.o. daily.  8.  Iron sulfate 325 mg p.o. daily.  9.  Ambien 5 mg p.o. as needed at bedtime for sleep.   CONDITION ON DISCHARGE:  The patient was discharged in good condition. At  time of discharge she was alert, oriented, and in no acute distress with  normal vital signs. She was instructed to follow up with Dr. Nicholos Johns as  previously scheduled in a week. At the time of follow-up would recommend a  follow-up CBC to assure hemoglobin stability.   PROCEDURES DURING THIS ADMISSION:  1.  The patient underwent a 2-D echocardiogram which showed a normal      ejection fraction and no left ventricular wall motion abnormalities.  2.  The patient underwent an MRI of the brain which was within  normal limits      with some normal atrophy for age and some small vessel disease.  3.  The patient underwent carotid ultrasound of that showed on the right a      40% to 60% ICA stenosis, mid range, and on the left a 40% to 60% ICA      stenosis. Vertebral artery flow was antegrade bilaterally.   CONSULTATIONS DURING ADMISSION:  No consultations were obtained.   For admission H&P, please refer to the history and physical dictated by Dr.  Brien Few on June 30, 2005.   HOSPITAL COURSE:   PROBLEM #1:  Syncope. The patient was admitted to [facility] emergency room  on June 30, 2005, for workup of her syncopal event. She spent the entire  night at [facility] emergency room due to lack of beds. On July 01, 2005, she was transferred to [facility] telemetry unit. She was  kept  on continuous monitoring throughout the night and she had cardiac  enzymes checked times three. She had no episodes of arrhythmia on the  monitor. A 2-D echocardiogram showed normal ejection fraction and normal  left ventricular contractility. The patient also had an MRI of the brain  that showed no signs of any stroke and she also had an ultrasound of the  neck. The carotid ultrasound showed mild bilateral ICA stenosis. I do not  think at this point that her symptoms of syncope are related to the carotid  artery stenosis as the most likely explanation for the syncopal event is the  fact she had decreased blood pressures and she was on a diuretic. At this  point the plan is to discontinue the patient's antihypertensives and follow  her as an outpatient without blood pressure medications. [name] was  encouraged to discuss her findings of the carotid artery stenosis with Dr.  Nicholos Johns and discuss about possible outpatient vascular surgery  consultation. Also for [name] we decided to continue using aspirin and  to increase the Lipitor to 5 mg at bedtime.   PROBLEM #2:  Hyperlipidemia. While  in the hospital we have checked a fasting  lipid panel for [name] with the following results, total cholesterol  130, triglycerides 80, HDL 45, and LDL 69. Due to the presence of the  carotid plaques we decided to increase the Lipitor dosage to 5 mg at bedtime  daily.   PROBLEM #3:  Depression versus grief reaction. The patient appeared in the  hospital to be extremely depressed, tearful, and sad. She recently has lost  her husband. While this could be a normal grief reaction I am also worried  about depression as [name] has been on antidepressants in the past. At  this point after discussing with [name] we are going to start Paxil  which she has used in the past with good results and primary care physician  will monitor closely.   PROBLEM #4:  Anemia. [name] carries a history of chronic anemia, but  her recent CBCs were within normal limits. She is status post splenectomy  for anemia. At this point iron studies show a total transferrin saturation  of 8% that points toward an iron deficiency. Since the patient had a recent  colonoscopy that confirmed diverticulosis and polyp removal, plan is to keep  [name] on iron and recheck of CBC will be done as an outpatient.   PROBLEM #5:  Hypokalemia secondary to diuretics. [name] would be  discharged on potassium tablets 40 mEq p.o. for five days. Since she is off  the diuretics there is no reason for her to have further hypokalemia and I  do not think there is any reason to recheck potassium.   PROBLEM #6:  Leukocytosis. Routine CBC checked showed white blood cell count  of 17,000 upon admission. Blood and urine cultures were obtained and they  were negative. [name] remained afebrile throughout the hospital stay.  Chest x-ray was clear without any signs of pneumonia. She did not appear to have any infectious process going on. Recheck of the CBC showed a white  blood cell count of 15,000 and I suspect this is  related to the acute event.  Further follow-up to be done with Dr. Nicholos Johns.   LABORATORY VALUES AT THE TIME OF DISCHARGE:  Sodium is 139, potassium 3.2,  chloride 104, bicarbonate 29, glucose 153, BUN 10, creatinine 0.8.  Hemoglobin was 10.7, hematocrit 32.1, platelet  count 362,000, and white  blood cell count 15,000.      Lonia Blood, M.D.  Electronically Signed     SL/MEDQ  D:  07/02/2005  T:  07/02/2005  Job:  [id number]   cc:   [name], M.D.  Fax: [phone]

## 2011-01-24 NOTE — Discharge Summary (Signed)
NAMEJONE, PANEBIANCO          ACCOUNT NO.:  [id number]   MEDICAL RECORD NO.:  [id number]          PATIENT TYPE:  INP   LOCATION:  1417                         FACILITY:  [facility]   PHYSICIAN:  Danae Chen, M.D.DATE OF BIRTH:  Feb 03, 1927   DATE OF ADMISSION:  07/09/2005  DATE OF DISCHARGE:  07/12/2005                                 DISCHARGE SUMMARY   PRIMARY CARE PHYSICIAN:  [name], M.D.   GASTROENTEROLOGIST:  Georgiana Spinner, M.D.   DISCHARGE DIAGNOSES:  1.  Lower GI bleed, diverticular bleed versus hemorrhoids.  2.  History of anemia.  3.  Acute blood loss anemia.  4.  History of hypertension.  5.  History of diverticulosis.   DISCHARGE MEDICATIONS:  The patient is to resume her home medications at  prior dosages except for she will stop her aspirin for 1 week as well as her  iron tablets until she is reevaluated by gastroenterologist in one weeks  time.   CONCLUSION:  Dr. Virginia Rochester, GI, please see his notes for full details.   BRIEF HOSPITAL COURSE:  The patient is a pleasant 77 year old female with a  history of anemia and diverticulosis who presented with complaints of bright  red blood per rectum x2 episodes the day prior to her admission. Prior to  that she had not had any episodes of dark colored stool, melena or  hematochezia. Initial evaluation showed that her hemoglobin was 10.9. At the  time of discharge, the most recent hemoglobin is 9.6 and will recheck in the  morning as well. The patient remained asymptomatic with no more evidence of  bleed during her hospital stay. She was hemodynamically stable and  tolerating a regular diet as well. She will be discharged and followup with  her primary gastroenterologist, Dr. Virginia Rochester, in one week's time.   CONDITION ON DISCHARGE:  Improved.   PHYSICAL EXAM ON DISCHARGE:  GENERAL:  She is alert and oriented, afebrile.  VITAL SIGNS:  Stable. Blood pressure is 140/83.  LUNGS:  Clear.  HEART:  Rate is regular.  ABDOMEN:  Soft, nontender with active bowel sounds.   Discharge H&H (hemoglobin and hematocrit) to be documented prior to the  patient being discharged.      Danae Chen, M.D.  Electronically Signed     RLK/MEDQ  D:  07/11/2005  T:  07/11/2005  Job:  [id number]   cc:   Georgiana Spinner, M.D.  Fax: [phone]   [name], M.D.  Fax: [phone]

## 2011-01-24 NOTE — H&P (Signed)
[name], [name]          ACCOUNT NO.:  [id number]   MEDICAL RECORD NO.:  [id number]          PATIENT TYPE:  EMS   LOCATION:  ED                           FACILITY:  [facility]   PHYSICIAN:  Sandria Bales. Ezzard Standing, M.D.  DATE OF BIRTH:  1926/09/10   DATE OF ADMISSION:  12/14/2005  DATE OF DISCHARGE:                                HISTORY & PHYSICAL   HISTORY OF PRESENT ILLNESS:  [name] is a 77 year old white female who  sees Dr. Nicholos Johns as her primary medical doctor, Dr. Huel Cote  from an OB/GYN standpoint.  She has known that she had a left inguinal  hernia for about a year and a half.  However, her husband died last year and  this has involved much of her time over the last year and she was not seen  for the hernia.   She started worrying about the hernia and saw one of my partners, Dr.  Angelia Mould. Derrell Lolling, approximately two weeks ago for this left inguinal  hernia.  I think Dr. Derrell Lolling had operated on her husband at a prior time.  However, at that time, she had no symptoms from the hernia and apparently  the decision was to do nothing to the hernia surgically.  However, she was  at work on Friday, April 6, when she started having vague abdominal pain,  discomfort and an increasing bulge in her left groin and she presented to  the [facility] Emergency Room on Sunday, December 14, 2005 with  nausea, vomiting, left groin mass.   From a GI standpoint, she has never had peptic ulcer disease, liver disease,  pancreatic disease.  She has had a negative colonoscopy by Dr. Sabino Gasser in  October 2004.  She has had a prior open cholecystectomy in 1965.  She had a  prior open splenectomy in 1954 for what she says was anemia.  This was done  at [facility].  She had an appendectomy around 1940 and she had tubal  ligation in 1961.   ALLERGIES:  CODEINE but this is remote, kind of vague and she cannot really  remember what her symptoms were.   CURRENT MEDICATIONS:  1.  Zoloft 50 mg daily.  2.  Diazepam 5 mg b.i.d. p.r.n. for anxiety.  3.  Hydrochlorothiazide 25 mg daily.  4.  Lipitor 5 mg daily.  5.  Baby aspirin 81 mg daily.  6.  Meclizine 25 mg three times a day p.r.n. dizziness.  7.  She takes calcium, fish oil, Centrum Silver, and iron.   REVIEW OF SYSTEMS:  NEUROLOGICAL:  No history of seizure or loss of  consciousness.  She said she passed out at work about six to eight months  ago and apparently this was attributed to antihypertensives that she is on  and she has dropped down just to her hydrochlorothiazide.  PULMONARY:  No history of pneumonia or tuberculosis.  CARDIAC:  She has had mild hypertension for a number of years but no chest  pain or cardiac evaluation.  GASTROINTESTINAL:  See history of present illness.  UROLOGIC:  No history of kidney stones or  kidney infections.   She is accompanied by her daughter, [name].  A son is also in the room.  She  works at [employer].   PHYSICAL EXAMINATION:  Her temperature is 98, blood pressure 140/94, pulse  is 77, respirations 18.  GENERAL: She is a well-developed white female who is of an average build,  alert and not in any extremis.  HEENT is unremarkable.  Her neck: is supple.  I found no masses and no thyromegaly.  Lymph nodes: show she has no supraclavicular, axillary or inguinal  adenopathy.  LUNGS: are clear to auscultation with symmetric breath sounds.  HEART: has a regular rate and rhythm.  ABDOMEN: shows a well-healed midline incision, a well-healed left subcostal  incision and then she has a suprapubic midline incision.  I do not feel  hernia any of the abdominal incisions but what she does have on her left  groin is about a 4 cm knot consistent with incarcerated left femoral hernia.  I was unable to reduce this in the emergency room.  She has no hernia on the right side.  EXTREMITIES: moved her upper and lower extremities which seem to be normal.   LABORATORY DATA:  Labs  show a sodium 136, potassium 3.7, chloride of 97, CO2  of 30, glucose of 137.  Her white blood count is 23,000, hemoglobin of 14,  hematocrit 43.  Urinalysis was unremarkable.  KUB shows some mildly dilated  small bowel loops but otherwise nonspecific.   DIAGNOSES:  1.  Incarcerated left inguinal hernia with possible bowel obstruction      associated with this.  I discussed with the patient and her children the      need for repair of this hernia.  I also discussed the possibility of      having bowel damage to require bowel resection.  I discussed the      possibility of infection, bleeding, the need for bowel resection and the      method of the repair will depend a little bit on how her bowel looks      when I finish.  2.  Nerves:  Doing well on her Zoloft and diazepam.  3.  Very mild hypertension:  The fact that her medicine has been cut back      because of some questionable orthostatic issues.  4.  Hypercholesterolemia.  5.  Dizziness more when she lays down.  6.  Status post splenectomy for which she has done well.      Sandria Bales. Ezzard Standing, M.D.  Electronically Signed     DHN/MEDQ  D:  12/14/2005  T:  12/14/2005  Job:  [id number]   cc:   [name], M.D.  Fax: [phone]   Huel Cote, M.D.  Fax: [phone]

## 2011-03-14 ENCOUNTER — Emergency Department (INDEPENDENT_AMBULATORY_CARE_PROVIDER_SITE_OTHER): Payer: Medicare HMO

## 2011-03-14 ENCOUNTER — Emergency Department (HOSPITAL_BASED_OUTPATIENT_CLINIC_OR_DEPARTMENT_OTHER)
Admission: EM | Admit: 2011-03-14 | Discharge: 2011-03-15 | Disposition: A | Discharge status: Home / Self Care | Payer: Medicare HMO | Source: Home / Self Care | Attending: Emergency Medicine | Admitting: Emergency Medicine

## 2011-03-14 ENCOUNTER — Other Ambulatory Visit: Payer: Self-pay

## 2011-03-14 DIAGNOSIS — R42 Dizziness and giddiness: Secondary | ICD-10-CM

## 2011-03-14 DIAGNOSIS — R55 Syncope and collapse: Secondary | ICD-10-CM

## 2011-03-14 DIAGNOSIS — G319 Degenerative disease of nervous system, unspecified: Secondary | ICD-10-CM

## 2011-03-14 DIAGNOSIS — I679 Cerebrovascular disease, unspecified: Secondary | ICD-10-CM

## 2011-03-14 LAB — COMPREHENSIVE METABOLIC PANEL
Albumin: 4.8 g/dL (ref 3.5–5.2)
BUN: 18 mg/dL (ref 6–23)
CO2: 26 mEq/L (ref 19–32)
Calcium: 9.8 mg/dL (ref 8.4–10.5)
Chloride: 105 mEq/L (ref 96–112)
Creatinine, Ser: 0.8 mg/dL (ref 0.50–1.10)
GFR calc non Af Amer: >60 mL/min (ref >60)
Potassium: 3.9 mEq/L (ref 3.5–5.1)
Sodium: 144 mEq/L (ref 135–145)

## 2011-03-14 LAB — DIFFERENTIAL
Eosinophils Relative: 2 % (ref 0–5)
Lymphocytes Relative: 30 % (ref 12–46)
Monocytes Relative: 12 % (ref 3–12)
Neutrophils Relative %: 55 % (ref 43–77)

## 2011-03-14 LAB — CBC
Platelets: 259 10*3/uL (ref 150–400)
RDW: 13 % (ref 11.5–15.5)

## 2011-03-14 LAB — CK TOTAL AND CKMB (NOT AT ARMC): Total CK: 238 U/L — ABNORMAL HIGH (ref 7–177)

## 2011-03-15 ENCOUNTER — Inpatient Hospital Stay (HOSPITAL_COMMUNITY)
Admission: EM | Admit: 2011-03-15 | Discharge: 2011-03-19 | DRG: 243 | Disposition: A | Discharge status: Home / Self Care | Payer: Medicare HMO | Source: Other Acute Inpatient Hospital | Attending: Cardiology | Admitting: Cardiology

## 2011-03-15 DIAGNOSIS — Z7982 Long term (current) use of aspirin: Secondary | ICD-10-CM

## 2011-03-15 DIAGNOSIS — D649 Anemia, unspecified: Secondary | ICD-10-CM | POA: Diagnosis present

## 2011-03-15 DIAGNOSIS — I519 Heart disease, unspecified: Secondary | ICD-10-CM | POA: Diagnosis present

## 2011-03-15 DIAGNOSIS — I214 Non-ST elevation (NSTEMI) myocardial infarction: Principal | ICD-10-CM | POA: Diagnosis present

## 2011-03-15 DIAGNOSIS — I059 Rheumatic mitral valve disease, unspecified: Secondary | ICD-10-CM

## 2011-03-15 DIAGNOSIS — I498 Other specified cardiac arrhythmias: Secondary | ICD-10-CM | POA: Diagnosis present

## 2011-03-15 DIAGNOSIS — R791 Abnormal coagulation profile: Secondary | ICD-10-CM | POA: Diagnosis present

## 2011-03-15 DIAGNOSIS — I447 Left bundle-branch block, unspecified: Secondary | ICD-10-CM | POA: Diagnosis present

## 2011-03-15 DIAGNOSIS — I10 Essential (primary) hypertension: Secondary | ICD-10-CM | POA: Diagnosis present

## 2011-03-15 DIAGNOSIS — I2510 Atherosclerotic heart disease of native coronary artery without angina pectoris: Secondary | ICD-10-CM | POA: Diagnosis present

## 2011-03-15 DIAGNOSIS — R55 Syncope and collapse: Secondary | ICD-10-CM | POA: Diagnosis present

## 2011-03-15 DIAGNOSIS — I5181 Takotsubo syndrome: Secondary | ICD-10-CM | POA: Diagnosis present

## 2011-03-15 DIAGNOSIS — E785 Hyperlipidemia, unspecified: Secondary | ICD-10-CM | POA: Diagnosis present

## 2011-03-15 DIAGNOSIS — I441 Atrioventricular block, second degree: Secondary | ICD-10-CM | POA: Diagnosis present

## 2011-03-15 DIAGNOSIS — I428 Other cardiomyopathies: Secondary | ICD-10-CM | POA: Diagnosis present

## 2011-03-15 DIAGNOSIS — F341 Dysthymic disorder: Secondary | ICD-10-CM | POA: Diagnosis present

## 2011-03-15 DIAGNOSIS — I509 Heart failure, unspecified: Secondary | ICD-10-CM | POA: Diagnosis present

## 2011-03-15 DIAGNOSIS — I5020 Unspecified systolic (congestive) heart failure: Secondary | ICD-10-CM | POA: Diagnosis present

## 2011-03-15 LAB — HEPARIN LEVEL (UNFRACTIONATED): Heparin Unfractionated: 0.28 IU/mL — ABNORMAL LOW (ref 0.30–0.70)

## 2011-03-15 LAB — CARDIAC PANEL(CRET KIN+CKTOT+MB+TROPI)
CK, MB: 8.6 ng/mL (ref 0.3–4.0)
Relative Index: 3.7 — ABNORMAL HIGH (ref 0.0–2.5)
Total CK: 224 U/L — ABNORMAL HIGH (ref 7–177)
Total CK: 235 U/L — ABNORMAL HIGH (ref 7–177)
Troponin I: 0.5 ng/mL (ref <0.30)

## 2011-03-15 LAB — CBC
MCH: 31.5 pg (ref 26.0–34.0)
MCHC: 33.6 g/dL (ref 30.0–36.0)
MCV: 93.6 fL (ref 78.0–100.0)
Platelets: 242 10*3/uL (ref 150–400)
RBC: 3.46 MIL/uL — ABNORMAL LOW (ref 3.87–5.11)
RDW: 13.3 % (ref 11.5–15.5)

## 2011-03-15 LAB — URINALYSIS, ROUTINE W REFLEX MICROSCOPIC
Nitrite: NEGATIVE
Specific Gravity, Urine: 1.005 (ref 1.005–1.030)
Urobilinogen, UA: 0.2 mg/dL (ref 0.0–1.0)

## 2011-03-15 LAB — URINE MICROSCOPIC-ADD ON

## 2011-03-16 DIAGNOSIS — R42 Dizziness and giddiness: Secondary | ICD-10-CM

## 2011-03-16 LAB — DIFFERENTIAL
Basophils Absolute: 0.1 10*3/uL (ref 0.0–0.1)
Basophils Relative: 1 % (ref 0–1)
Eosinophils Absolute: 0.6 10*3/uL (ref 0.0–0.7)
Eosinophils Relative: 4 % (ref 0–5)
Lymphocytes Relative: 34 % (ref 12–46)
Lymphs Abs: 5 10*3/uL — ABNORMAL HIGH (ref 0.7–4.0)
Monocytes Absolute: 1.8 10*3/uL — ABNORMAL HIGH (ref 0.1–1.0)
Monocytes Relative: 12 % (ref 3–12)
Neutro Abs: 7.4 10*3/uL (ref 1.7–7.7)
Neutrophils Relative %: 50 % (ref 43–77)

## 2011-03-16 LAB — URINE CULTURE
Colony Count: 25000
Culture  Setup Time: 201207070737

## 2011-03-16 LAB — CBC
Platelets: 293 10*3/uL (ref 150–400)
RDW: 13.2 % (ref 11.5–15.5)
WBC: 14.9 10*3/uL — ABNORMAL HIGH (ref 4.0–10.5)

## 2011-03-16 LAB — HEPARIN LEVEL (UNFRACTIONATED)
Heparin Unfractionated: 0.39 IU/mL (ref 0.30–0.70)
Heparin Unfractionated: 0.81 IU/mL — ABNORMAL HIGH (ref 0.30–0.70)
Heparin Unfractionated: 0.93 IU/mL — ABNORMAL HIGH (ref 0.30–0.70)

## 2011-03-17 DIAGNOSIS — R55 Syncope and collapse: Secondary | ICD-10-CM

## 2011-03-17 DIAGNOSIS — I2510 Atherosclerotic heart disease of native coronary artery without angina pectoris: Secondary | ICD-10-CM

## 2011-03-17 LAB — BASIC METABOLIC PANEL
Chloride: 104 mEq/L (ref 96–112)
Creatinine, Ser: 0.71 mg/dL (ref 0.50–1.10)
GFR calc Af Amer: >60 mL/min (ref >60)
GFR calc non Af Amer: >60 mL/min (ref >60)
Potassium: 4.3 mEq/L (ref 3.5–5.1)

## 2011-03-17 LAB — DIFFERENTIAL
Basophils Relative: 1 % (ref 0–1)
Eosinophils Absolute: 0.6 10*3/uL (ref 0.0–0.7)
Neutrophils Relative %: 47 % (ref 43–77)

## 2011-03-17 LAB — CBC
Platelets: 258 10*3/uL (ref 150–400)
RBC: 3.8 MIL/uL — ABNORMAL LOW (ref 3.87–5.11)
WBC: 16 10*3/uL — ABNORMAL HIGH (ref 4.0–10.5)

## 2011-03-17 LAB — HEPARIN LEVEL (UNFRACTIONATED)
Heparin Unfractionated: 0.21 IU/mL — ABNORMAL LOW (ref 0.30–0.70)
Heparin Unfractionated: 0.19 IU/mL — ABNORMAL LOW (ref 0.30–0.70)

## 2011-03-17 LAB — MRSA PCR SCREENING: MRSA by PCR: NEGATIVE

## 2011-03-18 DIAGNOSIS — I5022 Chronic systolic (congestive) heart failure: Secondary | ICD-10-CM

## 2011-03-18 DIAGNOSIS — I441 Atrioventricular block, second degree: Secondary | ICD-10-CM

## 2011-03-18 LAB — BASIC METABOLIC PANEL
CO2: 28 mEq/L (ref 19–32)
Calcium: 9.5 mg/dL (ref 8.4–10.5)
Creatinine, Ser: 0.89 mg/dL (ref 0.50–1.10)
GFR calc Af Amer: >60 mL/min (ref >60)
GFR calc non Af Amer: >60 mL/min (ref >60)
Sodium: 137 mEq/L (ref 135–145)

## 2011-03-18 LAB — CBC
MCH: 31.7 pg (ref 26.0–34.0)
MCHC: 33.6 g/dL (ref 30.0–36.0)
Platelets: 269 10*3/uL (ref 150–400)
RBC: 3.72 MIL/uL — ABNORMAL LOW (ref 3.87–5.11)
RDW: 13.5 % (ref 11.5–15.5)

## 2011-03-18 LAB — DIFFERENTIAL
Basophils Relative: 1 % (ref 0–1)
Eosinophils Absolute: 0.5 10*3/uL (ref 0.0–0.7)
Eosinophils Relative: 4 % (ref 0–5)
Monocytes Relative: 14 % — ABNORMAL HIGH (ref 3–12)
Neutrophils Relative %: 49 % (ref 43–77)

## 2011-03-19 ENCOUNTER — Inpatient Hospital Stay (HOSPITAL_COMMUNITY): Payer: Medicare HMO

## 2011-03-19 LAB — DIFFERENTIAL
Basophils Absolute: 0.1 10*3/uL (ref 0.0–0.1)
Lymphocytes Relative: 20 % (ref 12–46)
Monocytes Absolute: 2.3 10*3/uL — ABNORMAL HIGH (ref 0.1–1.0)
Neutro Abs: 9.5 10*3/uL — ABNORMAL HIGH (ref 1.7–7.7)

## 2011-03-19 LAB — CBC
HCT: 35.4 % — ABNORMAL LOW (ref 36.0–46.0)
Hemoglobin: 12.1 g/dL (ref 12.0–15.0)
WBC: 15.4 10*3/uL — ABNORMAL HIGH (ref 4.0–10.5)

## 2011-03-21 ENCOUNTER — Ambulatory Visit: Payer: Medicare HMO | Admitting: *Deleted

## 2011-03-22 NOTE — H&P (Signed)
[name], [name] [name] NO.:  [id number]  MEDICAL RECORD NO.:  [id number]  LOCATION:  3728                         FACILITY:  [facility]  PHYSICIAN:  [name] Rotunda, MD, FACCDATE OF BIRTH:  03-Dec-1926  DATE OF ADMISSION:  03/15/2011 DATE OF DISCHARGE:                             HISTORY & PHYSICAL   PRIMARY CARE PHYSICIAN:  [name] Fick, MD  CARDIOLOGIST:  None.  REASON FOR PRESENTATION:  Evaluate the patient with syncope.  HISTORY OF PRESENT ILLNESS:  The patient is a pleasant 77 year old white female without prior cardiac history.  She, today while getting out of her car, had some hip discomfort which is not unusual for her.  She then felt dizzy on standing.  She sat back down in the car and apparently lost consciousness though this was not witnessed.  There was no loss of bowel or bladder.  She tried to get up a little while after this and again felt dizzy.  A friend was able to help her into the house.  She was brought to [facility] where she was noticed to have left bundle-branch block with Mobitz type 2 heart block, but no prolonged pauses.  CK and MB were slightly elevated though troponin x1 is normal. I do not see any orthostatic vital signs.  Her blood pressure was otherwise reasonably within normal limits.  She did have a similar episode of dizziness a few days ago but no syncope.  She denies any recent symptoms.  She says she is active.  She does not get chest pressure, neck, or arm discomfort.  She does not have shortness of breath, PND, or orthopnea.  She never noticed any palpitations.  She does not describe orthostatic symptoms.  PAST MEDICAL HISTORY:  Previous GI bleeding with a small hemorrhoids, hypertension, anemia, diverticulosis, hyperlipidemia, depression, anxiety.  PAST SURGICAL HISTORY:  Carpal tunnel surgery, inguinal hernia repair, splenectomy, cholecystectomy, appendectomy, tubal ligation.  ALLERGIES/INTOLERANCES:   CODEINE.  MEDICATIONS: 1. Sertraline 150 mg at bedtime. 2. Diazepam 5 mg daily. 3. Lisinopril HCT 20/12.5 daily. 4. Lipitor 5 mg daily. 5. Trazodone 50 mg at bedtime. 6. Alendronate. 7. Aspirin 81 mg daily. 8. Calcium. 9. Senna. 10.Centrum Silver. 11.Iron. 12.Ativan.  SOCIAL HISTORY:  The patient lives alone.  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  She is a widow.  She does have children.  She does not smoke cigarettes and does not drink alcohol.  FAMILY HISTORY:  Noncontributory for early coronary artery disease.  REVIEW OF SYSTEMS:  As stated in the HPI and negative for all other systems.  PHYSICAL EXAMINATION:  GENERAL:  The patient is in no distress. VITAL SIGNS:  Blood pressure 142/72, heart rate 84 and regular, respiratory rate 18, afebrile. HEENT:  Eyelids are unremarkable.  Pupils equal, round, and reactive to light.  Fundi not visualized.  Oral mucosa unremarkable. NECK:  No jugular distention at 45 degrees, carotid upstroke brisk and symmetrical.  No bruits, no thyromegaly. LYMPHATICS:  No cervical, axillary, or inguinal adenopathy. LUNGS:  Clear to auscultation bilaterally. BACK:  No costovertebral angle tenderness. CHEST:  Unremarkable. HEART:  PMI not displaced or sustained.  S1 and S2 within normal limits. No S3, no S4,  no clicks, no rubs, no murmurs. ABDOMEN:  Flat, positive bowel sounds, normal in frequency and pitch, no bruits, no rebound, no guarding, no midline pulsatile mass, no hepatomegaly, no splenomegaly. SKIN:  No rashes, no nodules. EXTREMITIES:  2+ pulses.  No edema, no cyanosis, and no clubbing. NEUROLOGIC:  Oriented to person, place, and time.  Cranial nerves II-XII grossly intact.  Motor grossly intact.  EKG, left bundle-branch block, normal sinus rhythm with a Mobitz type 2 conduction pattern.  Chest x-ray, no acute disease.  Head CT, no acute findings.  Sodium 144, potassium 3.9, BUN 18, creatinine 0.8.  CK 238, MB  7.4, troponin less than 0.3.  WBC 12.9, hemoglobin 12.3, platelets 259.  ASSESSMENT AND PLAN: 1. Syncope.  The patient does have syncope with conduction     disturbances as described.  I suspect that these are related.  I     will check a TSH.  She needs an echocardiogram.  I will watch her     on telemetry.  We will need to discuss possible pacemaker placement     with the EP Service. 2. Depression.  She will continue the meds as listed. 3. Hypertension.  I will continue her previous medications, dose     adjusting if necessary.    [name] Rotunda, MD, [facility]    JH/MEDQ  D:  03/15/2011  T:  03/15/2011  Job:  [id number]  Electronically Signed by [name] Rotunda MD [facility] on 03/22/2011 04:03:44 PM

## 2011-03-27 ENCOUNTER — Ambulatory Visit: Payer: Medicare HMO | Admitting: *Deleted

## 2011-03-27 ENCOUNTER — Ambulatory Visit (INDEPENDENT_AMBULATORY_CARE_PROVIDER_SITE_OTHER): Payer: Medicare HMO | Admitting: *Deleted

## 2011-03-27 DIAGNOSIS — I428 Other cardiomyopathies: Secondary | ICD-10-CM

## 2011-03-27 DIAGNOSIS — I441 Atrioventricular block, second degree: Secondary | ICD-10-CM

## 2011-03-27 LAB — PACEMAKER DEVICE OBSERVATION
AL THRESHOLD: 0.75 V
ATRIAL PACING PM: 15
DEVICE MODEL PM: 3648000
LV LEAD THRESHOLD: 0.5 V
RV LEAD AMPLITUDE: 12 mv

## 2011-03-27 NOTE — Progress Notes (Signed)
Pacer checked in device clinic today. 

## 2011-03-27 NOTE — Consult Note (Signed)
NAMEVIVIAN, [name] NO.:  [id number]  MEDICAL RECORD NO.:  [id number]  LOCATION:  2927                         FACILITY:  [facility]  PHYSICIAN:  Duke Salvia, MD, FACCDATE OF BIRTH:  11-12-1926  DATE OF CONSULTATION:  03/17/2011 DATE OF DISCHARGE:                                CONSULTATION   Thank you very much for asking [place] to see [name] in consultation because of syncope.  The patient is an 77 year old woman who presented to the hospital following an episode of syncope.  She says she had come home, she tried to get out of her car became profoundly dizzy, sat down back in the car. She waited a couple of minutes.  She again tried to stand up.  Again, she was profoundly dizzy.  She laid back down in the car and then apparently passed out across the console of her car.  Upon arousing, she called friends and her friends were able to get her into the house and then she was taken to the [facility] where she was found to have a relatively low blood pressure of 100.  She is also found to be in second-degree heart block presumed type 2 with left bundle-branch block and she is referred to [facility] for further evaluation.  She has one prior episode of syncope that occurred some time ago after coming off the commode.  She denies other episodes of syncope.  She denies chest pain, exercise intolerance, or peripheral edema.  Cardiac evaluation while at hospital this time has included abnormal cardiac enzymes with a peak CK-MB of 8.8 with a CK total of 235 and a peak troponin of 1.05.  She was also found by echo to have an ejection fraction of 35% with severe hypokinesis of the inferoseptal, apical, and mid anteroseptal walls.  No significant valvular disease or right-sided abnormalities were appreciated.  She is continued to have periodic dropped beats in hospital, but at no point I have seen more than one dropped beat.  PR prolongation has not been  identified.  Her past medical history is notable for GI bleeding, hypertension, anemia, diverticulosis, hyperlipidemia, depression, anxiety.  Her past surgical history is notable for carpal tunnel surgery, inguinal hernia, splenectomy, cholecystectomy, appendectomy and tubal ligation. She is also have an incarcerated hernia.  Medications on arrival included sertraline 150, diazepam 5, lisinopril HCT 20/12.5, Lipitor 5, trazodone 50, aspirin 81 and variety of other nutraceuticals.  She is an allergic/intolerant of CODEINE.  Social history, she lives alone.  She still works, she is widowed and has number of children.  She did not use cigarettes or alcohol.  Her family history is notable for coronary artery disease evidenced in her son.  Review of systems is negative as opposed to what happened what has been noted previously.  PHYSICAL EXAMINATION:  GENERAL:  She is an elderly Caucasian female appearing her stated age of 46. VITAL SIGNS:  Her blood pressure today was 136/71.  Her pulse was 65 and irregular.  She was afebrile.  O2 sat was 97. HEENT:  Exam was normal.  Her neck veins were flat.  Her carotids are brisk and full bilaterally without bruits.  Carotid sinus massage was  negative. BACK:  Without scoliosis or kyphosis. LUNGS:  Were clear. HEART:  Sounds were regular without murmurs or gallops. ABDOMEN:  Soft with active bowel sounds without midline pulsation. Femoral pulses were not examined.  Distal pulses were intact.  There is no clubbing, cyanosis or edema. SKIN:  Warm and dry. NEUROLOGIC:  She is alert and oriented.  Her cranial nerve and motor and sensory exam was grossly normal.  Affect was somewhat labile and tearful.  Electrocardiogram dated March 15, 2011, at 3:47 a.m. demonstrates sinus rhythm at 66 with intervals of 0.20/0.15/0.43, the axis was leftward of -47.  There is left bundle-branch block.  Telemetry since then has demonstrated multiple episodes of  dropped P-waves without PR prolongation.  IMPRESSION: 1. Syncope with protracted lightheadedness suggestive of either a     prolonged arrhythmia episode or hypotension. 2. Left bundle-branch block and second-degree AV block Mobitz type I     versus II probably the latter based on lack of PR prolongation. 3. Cardiomyopathy - ejection fraction of 35%.     a.     Presumed ischemic with new wall motion abnormality compared      in 2006.     b.     Positive enzymes consistent with a NONSTEMI. 4. Congestive heart failure - class I-II. 5. Hypertension.  [name] has syncope that was somewhat that occurred in setting of protracted dizziness.  This suggests either prolonged hypotension which has not been a problem for her or more likely a protracted brady arrhythmia.  Certainly, suggested by her conduction system disease manifested by the left bundle-branch block and the second-degree AV block.  The temporal relationship, however, the cardiac enzymes are hard for me to understand at this point.  With her wall motion abnormalities in her left bundle-branch block.  She likely has significant underlying coronary artery disease and catheterization is indicated. Revascularization would be appropriate and then in this context making the decision as to what to do about device implantation would follow. Almost certainly regardless of what as needed to do about revascularization she will require backup brady pacing which would probably most appropriately be done with resynchronization given her left bundle-branch block.  The need for an ICD would depend upon the extent of likely reversible coronary artery disease.  The issue of device implantation in this context will be outside of guidelines probably because of the need for device based therapy with temporal context of what is likely a small NONSTEMI.    Duke Salvia, MD, [facility]    SCK/MEDQ  D:  03/17/2011  T:  03/18/2011  Job:   [id number]  Electronically Signed by Sherryl Manges MD [facility] on 03/27/2011 02:13:56 PM

## 2011-03-31 NOTE — Op Note (Signed)
[name], [name]:  [id number]  MEDICAL RECORD NO.:  [id number]  LOCATION:  6527                         FACILITY:  [facility]  PHYSICIAN:  Hillis Range, MD       DATE OF BIRTH:  1927-07-14  DATE OF PROCEDURE: DATE OF DISCHARGE:                              OPERATIVE REPORT   SURGEON:  Hillis Range, MD  PREPROCEDURE DIAGNOSES: 1. Mobitz II second-degree atrioventricular block with syncope. 2. Nonischemic cardiomyopathy. 3. Systolic dysfunction. 4. Left bundle-branch block.  POSTPROCEDURE DIAGNOSES: 1. Mobitz II second-degree atrioventricular block with syncope. 2. Nonischemic cardiomyopathy. 3. Systolic dysfunction. 4. Left bundle-branch block.  PROCEDURES:  Biventricular pacemaker implantation with selective angiography of the coronary sinus.  INTRODUCTION:  [name] is a very pleasant 77 year old female who was admitted to [facility] following an episode of syncope.  She was found to have Mobitz II second-degree AV block with a first-degree AV block and left bundle-branch block also observed.  Her ejection fraction was notably depressed.  She underwent left heart catheterization, which revealed nonobstructive coronary artery disease and was felt to be consistent with a takotsubo cardiomyopathy.  She now presents for biventricular pacemaker implantation.  DESCRIPTION OF PROCEDURE:  Informed written consent was obtained and the patient was brought to the electrophysiology lab in the fasting state. She was adequately sedated with intravenous Versed and fentanyl as outlined in the nursing report.  The patient's left chest was prepped and draped in the usual sterile fashion by the EP lab staff.  The left deltopectoral region was infiltrated with lidocaine for local analgesia. A 5-cm incision was made over the left deltopectoral region.  A left subcutaneous pacemaker pocket was fashioned using a combination of sharp and blunt dissection.   Electrocautery was required to assure hemostasis. A venogram of the left upper extremity was performed by hand injection of nonionic contrast and demonstrated a moderate-sized left axillary vein with a small left cephalic vein, which both emptied into a moderate- sized left subclavian vein.  The left axillary vein was therefore cannulated with fluoroscopic visualization.  Through the left axillary vein, a St. Jude Medical [place], model 4098JX-91 (serial number [id number]) right atrial lead and a St. Jude Medical, model [id number] (serial number [id number]) right ventricular pacing lead were advanced with fluoroscopic visualization into the right atrial appendage and right ventricular apex positions respectively.  Initial atrial lead P- waves measured 3.6 mV with impedance of 575 ohms and a threshold of 1.5 volts at 0.5 milliseconds.  Right ventricular lead R-waves measured 23 mV with impedance of 751 ohms and a threshold of 0.5 volts at 0.5 milliseconds.  Both leads were secured to the pectoralis fascia using #2 silk suture over the suture sleeves.  A Medtronic extended hook guide was introduced through the left axillary vein into the lower right atrium.  A curved hexapolar Damato catheter was introduced through the extended hook guide and advanced into the body of the coronary sinus. The extended hook guide was then advanced over the catheter into the body of the coronary sinus.  Selective angiography of the coronary sinus was performed.  This demonstrated a moderate-sized body to the coronary sinus.  There were very tiny distal branches of  the coronary sinus, which were too small to be engaged.  There was a small lateral branch of the coronary sinus along the midportion as well as a small proximal posterior branch.  A Mailman wire was introduced through the extended hook guide into the distal portion of the lateral branch of the coronary sinus.  The extended hook guide was then advanced into  the proximal portion of this selected vein.  A St. Jude Medical QuickFlex micro model [phone] (serial number [id number]) coronary sinus lead was advanced over the [facility] wire into the lateral branch of the coronary sinus.  In order to maintain stability, I had to advance this into the apical third of the branch.  In this location, the lead was noted to be stable and suitable for pacing.  With a ring to ring configuration, impedance of 661 ohms with a threshold of 1.5 volts at 0.5 milliseconds with no diaphragmatic stimulation observed.  With a tip terrain configuration, the impedance was 718 ohms with a threshold of 0.7 volts at 0.5 milliseconds with diaphragmatic stimulation observed and 1.8 volts. With LV to unipolar pacing the threshold was 0.5 volts at 0.5 milliseconds with diaphragmatic stimulation observed at 1.7 volts.  With the bipolar configuration, the threshold was 1 volt at 0.5 milliseconds with diaphragmatic stimulation observed at 2.5 volts.  The lead was secured to the pectoralis fascia.  All three leads were then connected to a St. Jude Medical and thumb model p.m. 3210 (serial number [id number]) biventricular pacemaker.  The pocket was irrigated with copious gentamicin solution.  The pacemaker was then placed into the pocket. The pocket was then closed in two layers with 2.0 Vicryl suture for the subcutaneous and subcuticular layers.  Steri-Strips and a sterile dressing were then applied.  There were no early apparent complications.  CONCLUSIONS: 1. Successful implantation of a St. Jude Medical Anthem biventricular     ICD for Mobitz II second-degree AV block, a nonischemic     cardiomyopathy, and syncope. 2. No early apparent complications.     Hillis Range, MD     JA/MEDQ  D:  03/18/2011  T:  03/19/2011  Job:  [id number]  cc:   [name], M.D.  Electronically Signed by Hillis Range MD on 03/31/2011 10:00:26 AM

## 2011-04-04 NOTE — Discharge Summary (Signed)
Lori, [name] NO.:  [id number]  MEDICAL RECORD NO.:  [id number]  LOCATION:  6527                         FACILITY:  [facility]  PHYSICIAN:  Rollene Rotunda, MD, FACCDATE OF BIRTH:  September 14, 1926  DATE OF ADMISSION:  03/15/2011 DATE OF DISCHARGE:  03/19/2011                              DISCHARGE SUMMARY   PRIMARY CARDIOLOGIST:  Rollene Rotunda, MD, [facility]  PRIMARY CARE PROVIDER:  [name], MD  DISCHARGE DIAGNOSIS:  Syncope.  SECONDARY DIAGNOSES: 1. Non-ST-segment elevation myocardial cardioversion. 2. Nonobstructive coronary disease by catheterization this admission. 3. Mobitz II heart block. 4. Presumed Takotsubo cardiomyopathy with an EF of 35% with apical     ballooning. 5. Hypertension. 6. Anemia. 7. Diverticulosis. 8. Hyperlipidemia. 9. Depression. 10.Anxiety. 11.History of GI bleeding. 12.History of hemorrhoids. 13.Status post carpal tunnel surgery. 14.Status post inguinal hernia repair. 15.Status post splenectomy. 16.Status post cholecystectomy. 17.Status post appendectomy. 18.Status post tubal ligation.  ALLERGIES:  CODEINE.  PROCEDURES: 1. Two-D echocardiogram March 15, 2011, revealing an EF of 35% with     severe hypokinesis of the mid inferoseptal, apical inferoseptal,     mid anteroseptal, apical intraseptal, mid inferior, apical     inferior, and apical anterior walls.  Mild LVH.  Mild mitral     regurgitation. 2. CT of the head performed March 14, 2011, showing diffuse atrophic and     small-vessel ischemic changes without evidence of acute     intracranial hemorrhage or mass lesion or acute infarct. 3. Left heart cardiac catheterization performed March 17, 2011, showing     moderate nonobstructive CAD including a 20% left main stenosis, 60%     proximal LAD stenosis, 30% distal LAD stenosis, 40% first diagonal     stenosis, 30% mid to distal left circumflex stenosis, 30-40%     proximal to mid RCA stenosis, and 50% RPDA  stenosis.  EF was 35%     with apical ballooning and dyskinesis suggestive of Takotsubo     cardiomyopathy. 4. Successful placement of a St. Jude Medical Anthem [id number] CRT-P     biventricular pacemaker performed March 18, 2011.  HISTORY OF PRESENT ILLNESS:  An 77 year old female with the above complex problem list.  She did not have prior cardiac history.  The patient was in her usual state of health until July 7 when she was getting out of her car and had sudden onset of presyncope followed by syncope.  Duration of syncope was unknown.  Upon regaining consciousness, she continued to feel dizzy and called a friend who helped her come into her house and she was subsequently was taken to the [facility] where she was found to be in Mobitz II heart block with a left bundle-branch block.  There were no prolonged pauses.  She was transferred to [facility] for further evaluation and management.  HOSPITAL COURSE:  The patient ruled in for non-ST-segment elevation MI eventually peaking her CK at 235, MB at 8.8, and troponin I at 1.05. Interestingly, the patient had no chest pain.  A 2-D echocardiogram was undertaken on July 7 showing newly reduced LV function with an EF of 35% and multiple wall motion abnormalities as outlined above.  It was felt that  the patient would require diagnostic catheterization as well as electrophysiologic evaluation given Mobitz II heart block.  Diagnostic catheterization was undertaken on March 17, 2011, showing moderate nonobstructive CAD as outlined above.  EF was 35% with apical dyskinesis and ballooning suggestive of Takotsubo cardiomyopathy.  Medical therapy and the EP evaluation was recommended.  Electrophysiology reviewed the patient's case and felt that her syncope was most consistent with hypotension and likely a postural event.  However in the setting of a Mobitz II AV block and also left bundle-branch block and presumed Takotsubo cardiomyopathy  that biventricular pacing was appropriate. Given advanced age and the patient's wish to avoid ICD, ICD placement was not felt to be appropriate.  The patient was taken to the EP lab on March 18, 2011, which she underwent successful placement of a St. Jude Medical Anthem RF biventricular pacemaker.  She tolerated this procedure well, however, postprocedure suffered from phrenic stimulation.  Device was interrogated and adjusted with resolution of phrenic stimulation.  Despite reduced EF, the patient has had no evidence of volume overload. She has been maintained on ACE inhibitor therapy and now that she has a pacemaker in place, we are initiating low-dose beta-blocker at the time of discharge.  We will arrange for followup in our office in approximately 2 weeks as well as usual wound/device followup.  DISCHARGE LABS:  Hemoglobin 12.1, hematocrit 35.4, WBC 15.4, platelets 261.  Sodium 137, potassium 3.6, chloride 100, CO2 of 28, BUN 15, creatinine 0.9, glucose 92, CK 187, MB 6.2, troponin-I 0.50.  MRSA screen was negative.  DISPOSITION:  The patient will be discharged home today in good condition.  FOLLOWUP PLANS AND APPOINTMENTS:  The patient will follow up at [facility] on March 27, 2011, at 2:00 p.m.Marland Kitchen  Follow up with Lori Newcomer, PA, on April 09, 2011, at 9:30 a.m.  she is to follow with Dr. Johney Frame on June 20, 2011, at 9 a.m.  She will follow up with Dr. Nicholos Johns as previously scheduled.  DISCHARGE MEDICATIONS: 1. Carvedilol 3.25 mg b.i.d. 2. alendronate 70 mg q. week. 3. Aspirin 8 mg daily. 4. Calcium carbonate plus D t.i.d. 5. Diazepam 5 mg daily. 6. Iron 65 mg daily. 7. Lipitor 10 mg half tablet daily. 8. Lisinopril/HCTZ 20/25 mg daily. 9. Multivitamin 1 tablet daily. 10.Sertraline 100 mg one and half tabs daily. 11.Trazodone 50 mg nightly.  OUTSTANDING LABS AND STUDIES:  None.  DURATION OF DISCHARGE ENCOUNTER:  Sixty minutes including  physician time.     [name], ANP   ______________________________ Rollene Rotunda, MD, [facility]    CB/MEDQ  D:  03/19/2011  T:  03/20/2011  Job:  [id number]  cc:   [name], M.D.  Electronically Signed by [name] ANP on 03/25/2011 04:48:07 PM Electronically Signed by Rollene Rotunda MD [facility] on 04/04/2011 07:49:01 PM

## 2011-04-08 ENCOUNTER — Encounter: Payer: Self-pay | Admitting: Physician Assistant

## 2011-04-09 ENCOUNTER — Encounter: Payer: Self-pay | Admitting: Physician Assistant

## 2011-04-09 ENCOUNTER — Ambulatory Visit (INDEPENDENT_AMBULATORY_CARE_PROVIDER_SITE_OTHER): Payer: Medicare HMO | Admitting: Physician Assistant

## 2011-04-09 DIAGNOSIS — I2510 Atherosclerotic heart disease of native coronary artery without angina pectoris: Secondary | ICD-10-CM

## 2011-04-09 DIAGNOSIS — I5181 Takotsubo syndrome: Secondary | ICD-10-CM

## 2011-04-09 DIAGNOSIS — I441 Atrioventricular block, second degree: Secondary | ICD-10-CM

## 2011-04-09 DIAGNOSIS — R55 Syncope and collapse: Secondary | ICD-10-CM

## 2011-04-09 DIAGNOSIS — I10 Essential (primary) hypertension: Secondary | ICD-10-CM

## 2011-04-09 DIAGNOSIS — E785 Hyperlipidemia, unspecified: Secondary | ICD-10-CM

## 2011-04-09 DIAGNOSIS — Z950 Presence of cardiac pacemaker: Secondary | ICD-10-CM

## 2011-04-09 NOTE — Assessment & Plan Note (Signed)
Followup with Dr. Johney Frame in October.

## 2011-04-09 NOTE — Progress Notes (Signed)
History of Present Illness: Primary Cardiologist:  Dr. Rollene Rotunda Primary Electrophysiologist:  Dr. Hillis Range  [name] is a 77 y.o. female who presents for post hospital follow up.  She has a history of hypertension, hyperlipidemia, prior GI bleeding, anemia, depression and anxiety.  She was admitted 7/7-7/11.  She presented with syncope.  EKG demonstrated Mobitz type II block with left bundle branch block.  Cardiac enzymes returned positive for NSTEMI.  Echocardiogram done 7/7: EF 35% with multiple wall motion abnormalities including inferoseptal, anteroseptal and inferior hypokinesis, mild LVH and mild MR.  Cardiac catheterization performed 7/9:dLM 20%, mLAD 60%, dLAD 30%, Dx 40%, mCFX at AV groove 30%, prox to mid RCA 30-40%, PDA 50%, EF 35% with apical ballooning and DK of the apex.  Findings were felt to be consistent with Tako-Tsubo cardiomyopathy.  She was also seen by electrophysiology.  ICD was not felt to be indicated given advanced age and according to the patient's wishes.  Therefore, she was taken for implantation of a biventricular pacemaker on 7/10 (St. Jude).  Her medications were adjusted to include beta blocker and ACE inhibitor.  She never had any signs or symptoms of volume overload.  She is doing ok.  She denies chest pain, dyspnea, orthopnea, PND or edema.  Weights have been stable.  No fevers or chills.  She has seen the EP clinic already for initial pacer check and wound check.  Past Medical History  Diagnosis Date  . Hemorrhoids   . Hypertension   . Anemia   . Diverticulitis   . Hyperlipidemia   . Depression   . Anxiety   . CAD (coronary artery disease)      a. NSTEMI 7/12 with cath: dLM 20%, mLAD 60%, dLAD 30%, Dx 40%, mCFX at AV groove 30%, prox to mid RCA 30-40%, PDA 50%, EF 35% with apical ballooning and DK of the apex;      . Takotsubo cardiomyopathy     a. echo 7/12:  EF 35%, inf-sep, AS and inf HK, mild LVH, mild MR  . Syncope     2/2 mobitz 2   . Mobitz type 2 second degree atrioventricular block   . LBBB (left bundle branch block)   . Biventricular cardiac pacemaker in situ     St. Jude    Current Outpatient Prescriptions  Medication Sig Dispense Refill  . alendronate (FOSAMAX) 70 MG tablet Take 70 mg by mouth every 7 (seven) days. Take with a full glass of water on an empty stomach.       Marland Kitchen aspirin 81 MG tablet Take 81 mg by mouth daily.        Marland Kitchen atorvastatin (LIPITOR) 10 MG tablet Take 5 mg by mouth daily.       . Calcium Carbonate-Vitamin D (CALCIUM 600 + D PO) Take by mouth 3 (three) times daily.        . carvedilol (COREG) 3.125 MG tablet Take 3.125 mg by mouth 2 (two) times daily with a meal.        . diazepam (VALIUM) 5 MG tablet Take 5 mg by mouth daily.       . ferrous sulfate ([employer] IRON) 325 (65 FE) MG tablet Take 325 mg by mouth daily with breakfast.       . fish oil-omega-3 fatty acids 1000 MG capsule Take 2 g by mouth 2 (two) times daily.       Marland Kitchen lisinopril-hydrochlorothiazide (PRINZIDE,ZESTORETIC) 20-12.5 MG per tablet Take 1 tablet by mouth daily.        Marland Kitchen  multivitamin-iron-minerals-folic acid (CENTRUM) chewable tablet Chew 1 tablet by mouth daily.        . Sennosides (CVS SENNA-C PO) Take by mouth as directed.        . sertraline (ZOLOFT) 100 MG tablet Take 100 mg by mouth daily.        . traZODone (DESYREL) 50 MG tablet Take 50 mg by mouth at bedtime.          Allergies: Allergies  Allergen Reactions  . Codeine     Vital Signs: BP 110/62  Pulse 66  Ht 5' 4.5" (1.638 m)  Wt 137 lb 6.4 oz (62.324 kg)  BMI 23.22 kg/m2  PHYSICAL EXAM: Well nourished, well developed, in no acute distress HEENT: normal Neck: no JVD Cardiac:  normal S1, S2; RRR; no murmur Lungs:  clear to auscultation bilaterally, no wheezing, rhonchi or rales Abd: soft, nontender, no hepatomegaly Ext: no edema; right radial site without hematoma or bruit Skin: warm and dry Neuro:  CNs 2-12 intact, no focal abnormalities  noted  EKG:  V. Paced, heart rate 66  ASSESSMENT AND PLAN:

## 2011-04-09 NOTE — Assessment & Plan Note (Signed)
Controlled.  Managed by her PCP.  She does have complaints of recent nonproductive cough.  I offered to change her ACE inhibitor to an ARB.  She will think about this for now.

## 2011-04-09 NOTE — Assessment & Plan Note (Signed)
She had nonobstructive coronary artery disease at cardiac catheterization.  Medical therapy is continued.  Continue aspirin statin.  She is interested in cardiac rehabilitation.  We will make that referral for her.

## 2011-04-09 NOTE — Patient Instructions (Signed)
You have been referred to CARDIAC REHAB DX 414.00  Your physician has requested that you have an echocardiogram DX 414.00 THIS IS TO BE SCHEDULED FOR 2 MONTHS A FEW DAYS BEFORE PT SEES DR. HOCHREIN. PT ALSO NEEDS APPOINTMENT TO SEE DR. HOCHREIN IN 2 MONTHS. Echocardiography is a painless test that uses sound waves to create images of your heart. It provides your doctor with information about the size and shape of your heart and how well your heart's chambers and valves are working. This procedure takes approximately one hour. There are no restrictions for this procedure.  Your physician recommends that you schedule a follow-up appointment in: 2 MONTHS WITH DR. HOCHREIN

## 2011-04-09 NOTE — Assessment & Plan Note (Signed)
I had a long discussion with the patient today regarding this diagnosis.  She had many questions and I spent over 30 minutes with her and her daughter answering her questions in reference to this diagnosis and her heart block and pacemaker.  Plan on follow up echo in 2 months and follow up with Dr. Antoine Poche thereafter.  Continue coreg and lisinopril.

## 2011-05-02 ENCOUNTER — Other Ambulatory Visit: Payer: Self-pay | Admitting: Internal Medicine

## 2011-05-02 DIAGNOSIS — Z1231 Encounter for screening mammogram for malignant neoplasm of breast: Secondary | ICD-10-CM

## 2011-05-06 NOTE — Cardiovascular Report (Signed)
  [name], [name]:  [id number]  MEDICAL RECORD NO.:  [id number]  LOCATION:  3728                         FACILITY:  [facility]  PHYSICIAN:  Bevelyn Buckles. Bensimhon, MDDATE OF BIRTH:  05-28-1927  DATE OF PROCEDURE:  03/17/2011 DATE OF DISCHARGE:                           CARDIAC CATHETERIZATION   PRIMARY CARE PHYSICIAN:  [name], MD  CARDIOLOGIST:  Rollene Rotunda, MD, [facility].  INDICATIONS:  [name] is a delightful 77 year old woman who was admitted with syncope.  She was found to have a left bundle-branch block and Mobitz type 2 second-degree heart block.  She has been seen by EP and scheduled for possible pacemaker versus ICD.  However, her troponins were mildly positive with troponin of 1.05.  She is thus referred for cardiac catheterization.  PROCEDURES PERFORMED: 1. Selective coronary angiography. 2. Left heart cath. 3. Left ventriculogram.  DESCRIPTION OF PROCEDURE:  The risks and indications were explained. Consent was signed and placed on the chart.  After documentation of a normal Allen's test in the right upper extremity, the right wrist area was prepped and draped in routine sterile fashion and anesthetized with 1% local lidocaine.  A 5-French arterial sheath was placed in the right radial artery using modified Seldinger technique.  3 mg of intra- arterial verapamil were given and 3000 units of systemic heparin were also given.  Standard catheters including JL3.5, JR4, and angled pigtail were used.  All catheters exchanges made over wire.  There were no apparent complications.  Central aortic pressure 105/58 with mean of 79. LV pressure 134/6 with EDP of 11.  There was no aortic stenosis.  Left main was calcified, had a distal 20% lesion.  LAD was a long vessel coursing to the apex.  It gave off a moderate- sized diagonal.  There was significant calcification throughout the proximal section.  In the mid LAD, there was a 60% focal  lesion.  In the distal LAD, there was a 30% lesion.  There was a 40% lesion in the diagonal.  Left circumflex gave off an OM1 and OM2.  There was a 30% lesion in the mid AV groove circ, otherwise normal.  Right coronary artery was calcified, gave off a PDA and 2 posterolaterals.  There was 30% to 40% lesion in the proximal to mid RCA and a 50% lesion in the PDA.  Left ventriculogram done in the RAO position showed an EF of 35% with apical ballooning and dyskinesis of the entire apex.  ASSESSMENT: 1. Nonobstructive coronary artery disease. 2. Moderate LV dysfunction with an EF of 35% with apparent apical     ballooning suggestive of takotsubo cardiomyopathy.  PLAN/DISCUSSION:  [name] has somewhat of a borderline lesion in the LAD, but this was looked at several angles and appears nonobstructive. I reviewed with Dr. [place] who agrees.  I suspect overall she likely has takotsubo and that her coronary artery disease is not critical.  She will follow up with EP for ICD versus pacemaker for her syncope.     Bevelyn Buckles. Bensimhon, MD     DRB/MEDQ  D:  03/17/2011  T:  03/17/2011  Job:  [id number]  Electronically Signed by Arvilla Meres MD on 05/06/2011 05:14:02 PM

## 2011-06-06 ENCOUNTER — Ambulatory Visit
Admission: RE | Admit: 2011-06-06 | Discharge: 2011-06-06 | Disposition: A | Discharge status: Home / Self Care | Payer: Medicare HMO | Source: Ambulatory Visit | Attending: Internal Medicine | Admitting: Internal Medicine

## 2011-06-06 DIAGNOSIS — Z1231 Encounter for screening mammogram for malignant neoplasm of breast: Secondary | ICD-10-CM

## 2011-06-20 ENCOUNTER — Ambulatory Visit (INDEPENDENT_AMBULATORY_CARE_PROVIDER_SITE_OTHER): Payer: Medicare HMO | Admitting: Internal Medicine

## 2011-06-20 ENCOUNTER — Encounter: Payer: Self-pay | Admitting: Internal Medicine

## 2011-06-20 DIAGNOSIS — I10 Essential (primary) hypertension: Secondary | ICD-10-CM

## 2011-06-20 DIAGNOSIS — I428 Other cardiomyopathies: Secondary | ICD-10-CM

## 2011-06-20 DIAGNOSIS — R55 Syncope and collapse: Secondary | ICD-10-CM

## 2011-06-20 DIAGNOSIS — I5181 Takotsubo syndrome: Secondary | ICD-10-CM

## 2011-06-20 DIAGNOSIS — I441 Atrioventricular block, second degree: Secondary | ICD-10-CM

## 2011-06-20 LAB — PACEMAKER DEVICE OBSERVATION
AL AMPLITUDE: 2.9 mv
AL IMPEDENCE PM: 350 Ohm
BAMS-0001: 150 {beats}/min
BATTERY VOLTAGE: 2.9629 V
RV LEAD AMPLITUDE: 12 mv

## 2011-06-20 NOTE — Assessment & Plan Note (Signed)
Clinically stable without symptoms of CHF. She has upcoming echo to evaluate her EF and follow-up with Dr Antoine Poche  BiV paced >99%

## 2011-06-20 NOTE — Assessment & Plan Note (Signed)
Normal biventricular pacemaker function See Pace Art report No changes today  

## 2011-06-20 NOTE — Assessment & Plan Note (Signed)
Stable No change required today  

## 2011-06-20 NOTE — Progress Notes (Signed)
The patient presents today for routine electrophysiology followup.  Since last being seen in our clinic, the patient reports doing very well.  Her weight is stable.  She has had no further syncope.  Today, she denies symptoms of palpitations, chest pain, shortness of breath, orthopnea, PND, lower extremity edema, dizziness, presyncope, syncope, or neurologic sequela.  The patient feels that she is tolerating medications without difficulties and is otherwise without complaint today.   Past Medical History  Diagnosis Date  . Hemorrhoids   . Hypertension   . Anemia   . Diverticulitis   . Hyperlipidemia   . Depression   . Anxiety   . CAD (coronary artery disease)      a. NSTEMI 7/12 with cath: dLM 20%, mLAD 60%, dLAD 30%, Dx 40%, mCFX at AV groove 30%, prox to mid RCA 30-40%, PDA 50%, EF 35% with apical ballooning and DK of the apex;      . Takotsubo cardiomyopathy     a. echo 7/12:  EF 35%, inf-sep, AS and inf HK, mild LVH, mild MR  . Syncope     2/2 mobitz 2  . Mobitz type 2 second degree atrioventricular block   . LBBB (left bundle branch block)   . Biventricular cardiac pacemaker in situ     St. Jude   Past Surgical History  Procedure Date  . Carpal tunnel release   . Inguinal hernia repair   . Splenectomy   . Cholecystectomy   . Appendectomy   . Tubal ligation   . Pacemaker insertion     BiV pacemaker implanted 7/12 by Dr Johney Frame    Current Outpatient Prescriptions  Medication Sig Dispense Refill  . acetaminophen (TYLENOL) 325 MG tablet Take 650 mg by mouth as needed.        Marland Kitchen alendronate (FOSAMAX) 70 MG tablet Take 70 mg by mouth every 7 (seven) days. Take with a full glass of water on an empty stomach.       Marland Kitchen aspirin 81 MG tablet Take 81 mg by mouth daily.        Marland Kitchen atorvastatin (LIPITOR) 10 MG tablet Take 5 mg by mouth daily.       . Calcium Carbonate-Vitamin D (CALCIUM 600 + D PO) Take by mouth 3 (three) times daily.        . carvedilol (COREG) 3.125 MG tablet Take 3.125  mg by mouth 2 (two) times daily with a meal.        . diazepam (VALIUM) 5 MG tablet Take 5 mg by mouth daily.       . ferrous sulfate ([employer]S IRON) 325 (65 FE) MG tablet Take 325 mg by mouth daily with breakfast.       . fish oil-omega-3 fatty acids 1000 MG capsule Take 2 g by mouth 2 (two) times daily.       Marland Kitchen lisinopril-hydrochlorothiazide (PRINZIDE,ZESTORETIC) 20-12.5 MG per tablet Take 1 tablet by mouth daily.        . multivitamin-iron-minerals-folic acid (CENTRUM) chewable tablet Chew 1 tablet by mouth daily.        . Sennosides (CVS SENNA-C PO) Take by mouth as directed.        . traZODone (DESYREL) 50 MG tablet Take 50 mg by mouth at bedtime.        Marland Kitchen venlafaxine (EFFEXOR-XR) 37.5 MG 24 hr capsule Take 37.5 mg by mouth daily.        Marland Kitchen venlafaxine (EFFEXOR-XR) 75 MG 24 hr capsule Take 75 mg by mouth  daily.          Allergies  Allergen Reactions  . Codeine     History   Social History  . Marital Status: Widowed    Spouse Name: N/A    Number of Children: N/A  . Years of Education: N/A   Occupational History  . Not on file.   Social History Main Topics  . Smoking status: Never Smoker   . Smokeless tobacco: Not on file  . Alcohol Use: No  . Drug Use: No  . Sexually Active: Not on file   Other Topics Concern  . Not on file   Social History Narrative   The patient lives alone. Still works at [employer] in the [employer]. She is a widow. She does have chikdren. She does not smoke cigarettes and she does not drink alcohol    ROS-  All systems are reviewed and are negative except as outlined in the HPI above   Physical Exam: Filed Vitals:   06/20/11 0925  BP: 141/78  Pulse: 65  Height: 5\' 4"  (1.626 m)  Weight: 142 lb (64.411 kg)    GEN- The patient is well appearing, alert and oriented x 3 today.   Head- normocephalic, atraumatic Eyes-  Sclera clear, conjunctiva pink Ears- hearing intact Oropharynx- clear Neck- supple, no JVP Lymph- no cervical  lymphadenopathy Lungs- Clear to ausculation bilaterally, normal work of breathing Chest- pacemaker pocket is well healed Heart- Regular rate and rhythm, no murmurs, rubs or gallops, PMI not laterally displaced GI- soft, NT, ND, + BS Extremities- no clubbing, cyanosis, or edema MS- no significant deformity or atrophy Skin- no rash or lesion Psych- euthymic mood, full affect Neuro- strength and sensation are intact  Pacemaker interrogation- reviewed in detail today,  See PACEART report  Assessment and Plan:

## 2011-06-20 NOTE — Patient Instructions (Signed)
Your physician wants you to follow-up in: 03/2012 with Dr Johney Frame Bonita Quin will receive a reminder letter in the mail two months in advance. If you don't receive a letter, please call our office to schedule the follow-up appointment.

## 2011-06-26 ENCOUNTER — Ambulatory Visit (HOSPITAL_COMMUNITY): Payer: Medicare HMO | Attending: Physician Assistant | Admitting: Radiology

## 2011-06-26 ENCOUNTER — Encounter: Payer: Self-pay | Admitting: Physician Assistant

## 2011-06-26 DIAGNOSIS — I10 Essential (primary) hypertension: Secondary | ICD-10-CM | POA: Insufficient documentation

## 2011-06-26 DIAGNOSIS — I2510 Atherosclerotic heart disease of native coronary artery without angina pectoris: Secondary | ICD-10-CM | POA: Insufficient documentation

## 2011-07-01 ENCOUNTER — Encounter: Payer: Self-pay | Admitting: Cardiology

## 2011-07-01 ENCOUNTER — Ambulatory Visit (INDEPENDENT_AMBULATORY_CARE_PROVIDER_SITE_OTHER): Payer: Medicare HMO | Admitting: Cardiology

## 2011-07-01 DIAGNOSIS — I2510 Atherosclerotic heart disease of native coronary artery without angina pectoris: Secondary | ICD-10-CM

## 2011-07-01 DIAGNOSIS — E785 Hyperlipidemia, unspecified: Secondary | ICD-10-CM

## 2011-07-01 DIAGNOSIS — I441 Atrioventricular block, second degree: Secondary | ICD-10-CM

## 2011-07-01 DIAGNOSIS — I10 Essential (primary) hypertension: Secondary | ICD-10-CM

## 2011-07-01 DIAGNOSIS — I5181 Takotsubo syndrome: Secondary | ICD-10-CM

## 2011-07-01 NOTE — Assessment & Plan Note (Signed)
She had non obstructive CAD and she will continue with risk reduction.

## 2011-07-01 NOTE — Progress Notes (Signed)
HPI The patient presents for follow Takatsubo's cardiomyopathy.  Since being seen in the hospital for syncope in second-degree heart block she done quite well. She is not having any palpitations and has had no further presyncope or syncope. She has had no chest pressure, neck or arm discomfort. She has had no shortness of breath, PND or orthopnea. She has had no weight gain or edema. She did have a followup echocardiogram demonstrating EF to be 60%. She has had followup in our device clinic.  Allergies  Allergen Reactions  . Codeine     Current Outpatient Prescriptions  Medication Sig Dispense Refill  . acetaminophen (TYLENOL) 325 MG tablet Take 650 mg by mouth as needed.        Marland Kitchen alendronate (FOSAMAX) 70 MG tablet Take 70 mg by mouth every 7 (seven) days. Take with a full glass of water on an empty stomach.       Marland Kitchen aspirin 81 MG tablet Take 81 mg by mouth daily.        Marland Kitchen atorvastatin (LIPITOR) 10 MG tablet Take 5 mg by mouth daily.       . Calcium Carbonate-Vitamin D (CALCIUM 600 + D PO) Take by mouth 2 (two) times daily.       . carvedilol (COREG) 3.125 MG tablet Take 3.125 mg by mouth 2 (two) times daily with a meal.        . diazepam (VALIUM) 5 MG tablet Take 5 mg by mouth daily.       . ferrous sulfate (CVS IRON) 325 (65 FE) MG tablet Take 325 mg by mouth daily with breakfast.       . fish oil-omega-3 fatty acids 1000 MG capsule Take 2 g by mouth 2 (two) times daily.       Marland Kitchen lisinopril-hydrochlorothiazide (PRINZIDE,ZESTORETIC) 20-12.5 MG per tablet Take 1 tablet by mouth daily.        . multivitamin-iron-minerals-folic acid (CENTRUM) chewable tablet Chew 1 tablet by mouth daily.        . Sennosides (CVS SENNA-C PO) Take by mouth as directed.        . traZODone (DESYREL) 50 MG tablet Take 50 mg by mouth at bedtime.        Marland Kitchen venlafaxine (EFFEXOR-XR) 37.5 MG 24 hr capsule Take 37.5 mg by mouth daily.        Marland Kitchen venlafaxine (EFFEXOR-XR) 75 MG 24 hr capsule Take 75 mg by mouth daily.           Past Medical History  Diagnosis Date  . Hemorrhoids   . Hypertension   . Anemia   . Diverticulitis   . Hyperlipidemia   . Depression   . Anxiety   . CAD (coronary artery disease)      a. NSTEMI 7/12 with cath: dLM 20%, mLAD 60%, dLAD 30%, Dx 40%, mCFX at AV groove 30%, prox to mid RCA 30-40%, PDA 50%, EF 35% with apical ballooning and DK of the apex;      . Takotsubo cardiomyopathy     a. echo 7/12:  EF 35%, inf-sep, AS and inf HK, mild LVH, mild MR;  b. echo 10/12: EF 60%, mild LAE  . Syncope     2/2 mobitz 2  . Mobitz type 2 second degree atrioventricular block   . LBBB (left bundle branch block)   . Biventricular cardiac pacemaker in situ     St. Jude    Past Surgical History  Procedure Date  . Carpal tunnel release   .  Inguinal hernia repair   . Splenectomy   . Cholecystectomy   . Appendectomy   . Tubal ligation   . Pacemaker insertion     BiV pacemaker implanted 7/12 by Dr Johney Frame    ROS:  As stated in the HPI and negative for all other systems.  PHYSICAL EXAM BP 123/73  Pulse 73  Resp 18  Ht 5\' 4"  (1.626 m)  Wt 140 lb 12.8 oz (63.866 kg)  BMI 24.17 kg/m2 GENERAL:  Well appearing HEENT:  Pupils equal round and reactive, fundi not visualized, oral mucosa unremarkable NECK:  No jugular venous distention, waveform within normal limits, carotid upstroke brisk and symmetric, no bruits, no thyromegaly LYMPHATICS:  No cervical, inguinal adenopathy LUNGS:  Clear to auscultation bilaterally BACK:  No CVA tenderness CHEST:  Pacemaker pocket well healed. HEART:  PMI not displaced or sustained,S1 and S2 within normal limits, no S3, no S4, no clicks, no rubs, no murmurs ABD:  Flat, positive bowel sounds normal in frequency in pitch, no bruits, no rebound, no guarding, no midline pulsatile mass, no hepatomegaly, no splenomegaly EXT:  2 plus pulses throughout, no edema, no cyanosis no clubbing SKIN:  No rashes no nodules NEURO:  Cranial nerves II through XII grossly  intact, motor grossly intact throughout PSYCH:  Cognitively intact, oriented to person place and time   EKG:  NSR rate 70 with ventricular pacing 07/01/2011   ASSESSMENT AND PLAN

## 2011-07-01 NOTE — Assessment & Plan Note (Signed)
The blood pressure is at target. No change in medications is indicated. We will continue with therapeutic lifestyle changes (TLC).  

## 2011-07-01 NOTE — Patient Instructions (Signed)
The current medical regimen is effective;  continue present plan and medications.  Follow up in 1 year with Dr Hochrein.  You will receive a letter in the mail 2 months before you are due.  Please call us when you receive this letter to schedule your follow up appointment.  

## 2011-07-01 NOTE — Assessment & Plan Note (Signed)
Her EF is back to normal.  She will continue with meds as listed.

## 2011-07-21 ENCOUNTER — Telehealth: Payer: Self-pay | Admitting: Cardiology

## 2011-07-21 NOTE — Telephone Encounter (Signed)
Spoke with pt who states that she has been having most every afternoon and early evening a thumbing feeling in her "stomach".  It occurs on her left side under her breast.  She has no SOB or dizziness.  She denies any other symptoms.  She says she is very worried about it because she lives alone and she is concerned it is her pacemaker.  Instructed pt that I will discuss with MD and call her back.

## 2011-07-21 NOTE — Telephone Encounter (Signed)
Pt called. She said on her left side she notices a her stomach move  And she wants to discuss with you what it might be. Could it be related to her pacemaker?

## 2011-07-23 ENCOUNTER — Ambulatory Visit (INDEPENDENT_AMBULATORY_CARE_PROVIDER_SITE_OTHER): Payer: Medicare HMO | Admitting: *Deleted

## 2011-07-23 ENCOUNTER — Encounter: Payer: Self-pay | Admitting: Internal Medicine

## 2011-07-23 DIAGNOSIS — I428 Other cardiomyopathies: Secondary | ICD-10-CM

## 2011-07-23 DIAGNOSIS — I441 Atrioventricular block, second degree: Secondary | ICD-10-CM

## 2011-07-23 LAB — PACEMAKER DEVICE OBSERVATION
ATRIAL PACING PM: 19
BAMS-0001: 150 {beats}/min
BAMS-0003: 70 {beats}/min
BRDY-0004LV: 120 {beats}/min
DEVICE MODEL PM: 2648000
LV LEAD THRESHOLD: 1 V
RV LEAD AMPLITUDE: 12 mv
RV LEAD IMPEDENCE PM: 737.5 Ohm

## 2011-07-23 NOTE — Progress Notes (Signed)
PPM check by industry. 

## 2011-09-19 ENCOUNTER — Encounter (INDEPENDENT_AMBULATORY_CARE_PROVIDER_SITE_OTHER): Payer: Medicare HMO | Admitting: Ophthalmology

## 2011-09-19 DIAGNOSIS — H2510 Age-related nuclear cataract, unspecified eye: Secondary | ICD-10-CM

## 2011-09-19 DIAGNOSIS — H3530 Unspecified macular degeneration: Secondary | ICD-10-CM

## 2011-09-19 DIAGNOSIS — H43819 Vitreous degeneration, unspecified eye: Secondary | ICD-10-CM

## 2011-09-19 DIAGNOSIS — I10 Essential (primary) hypertension: Secondary | ICD-10-CM

## 2011-09-19 DIAGNOSIS — H35039 Hypertensive retinopathy, unspecified eye: Secondary | ICD-10-CM

## 2011-10-14 ENCOUNTER — Other Ambulatory Visit: Payer: Self-pay | Admitting: *Deleted

## 2011-10-14 MED ORDER — CARVEDILOL 3.125 MG PO TABS: 3.1250 mg | ORAL_TABLET | Freq: Two times a day (BID) | ORAL | Status: DC

## 2012-03-22 ENCOUNTER — Encounter: Payer: Self-pay | Admitting: Internal Medicine

## 2012-03-22 ENCOUNTER — Ambulatory Visit (INDEPENDENT_AMBULATORY_CARE_PROVIDER_SITE_OTHER): Payer: Medicare HMO | Admitting: Internal Medicine

## 2012-03-22 VITALS — BP 122/68 | HR 71 | Resp 18 | Ht 62.0 in | Wt 142.0 lb

## 2012-03-22 DIAGNOSIS — I10 Essential (primary) hypertension: Secondary | ICD-10-CM

## 2012-03-22 DIAGNOSIS — I5181 Takotsubo syndrome: Secondary | ICD-10-CM

## 2012-03-22 DIAGNOSIS — I441 Atrioventricular block, second degree: Secondary | ICD-10-CM

## 2012-03-22 LAB — PACEMAKER DEVICE OBSERVATION
AL AMPLITUDE: 3.1 mv
AL IMPEDENCE PM: 350 Ohm
BATTERY VOLTAGE: 2.9478 V
LV LEAD THRESHOLD: 1.25 V
RV LEAD AMPLITUDE: 12 mv
RV LEAD IMPEDENCE PM: 662.5 Ohm

## 2012-03-22 NOTE — Patient Instructions (Addendum)
Your physician wants you to follow-up in: 12 months with Dr Allred You will receive a reminder letter in the mail two months in advance. If you don't receive a letter, please call our office to schedule the follow-up appointment.   Remote monitoring is used to monitor your Pacemaker of ICD from home. This monitoring reduces the number of office visits required to check your device to one time per year. It allows us to keep an eye on the functioning of your device to ensure it is working properly. You are scheduled for a device check from home on 06/28/2012. You may send your transmission at any time that day. If you have a wireless device, the transmission will be sent automatically. After your physician reviews your transmission, you will receive a postcard with your next transmission date.   

## 2012-03-22 NOTE — Assessment & Plan Note (Signed)
Stable No change required today  

## 2012-03-22 NOTE — Progress Notes (Signed)
PCP: [name], MD Primary Cardiologist:  Dr Antoine Poche  The patient presents today for routine electrophysiology followup.  Since last being seen in our clinic, the patient reports doing very well. Today, she denies symptoms of palpitations, chest pain, shortness of breath, orthopnea, PND, lower extremity edema, dizziness, presyncope, syncope, or neurologic sequela.  The patient feels that she is tolerating medications without difficulties and is otherwise without complaint today.   Past Medical History  Diagnosis Date  . Hemorrhoids   . Hypertension   . Anemia   . Diverticulitis   . Hyperlipidemia   . Depression   . Anxiety   . CAD (coronary artery disease)      a. NSTEMI 7/12 with cath: dLM 20%, mLAD 60%, dLAD 30%, Dx 40%, mCFX at AV groove 30%, prox to mid RCA 30-40%, PDA 50%, EF 35% with apical ballooning and DK of the apex;      . Takotsubo cardiomyopathy     a. echo 7/12:  EF 35%, inf-sep, AS and inf HK, mild LVH, mild MR;  b. echo 10/12: EF 60%, mild LAE  . Syncope     2/2 mobitz 2  . Mobitz type 2 second degree atrioventricular block   . LBBB (left bundle branch block)   . Biventricular cardiac pacemaker in situ     St. Jude   Past Surgical History  Procedure Date  . Carpal tunnel release   . Inguinal hernia repair   . Splenectomy   . Cholecystectomy   . Appendectomy   . Tubal ligation   . Pacemaker insertion     BiV pacemaker implanted 7/12 by Dr Johney Frame    Current Outpatient Prescriptions  Medication Sig Dispense Refill  . alendronate (FOSAMAX) 70 MG tablet Take 70 mg by mouth every 7 (seven) days. Take with a full glass of water on an empty stomach.       Marland Kitchen aspirin 81 MG tablet Take 81 mg by mouth daily.        Marland Kitchen atorvastatin (LIPITOR) 10 MG tablet Take 5 mg by mouth daily.       . Calcium Carbonate-Vitamin D (CALCIUM 600 + D PO) Take by mouth 2 (two) times daily.       . carvedilol (COREG) 3.125 MG tablet Take 1 tablet (3.125 mg total) by mouth 2 (two)  times daily with a meal.  60 tablet  6  . diazepam (VALIUM) 5 MG tablet Take 5 mg by mouth daily.       . ferrous sulfate (CVS IRON) 325 (65 FE) MG tablet Take 325 mg by mouth daily with breakfast.       . fish oil-omega-3 fatty acids 1000 MG capsule Take 2 g by mouth 2 (two) times daily.       Marland Kitchen lisinopril-hydrochlorothiazide (PRINZIDE,ZESTORETIC) 20-12.5 MG per tablet Take 1 tablet by mouth daily.        . multivitamin-iron-minerals-folic acid (CENTRUM) chewable tablet Chew 1 tablet by mouth daily.        . Sennosides (CVS SENNA-C PO) Take by mouth as directed.        . traZODone (DESYREL) 50 MG tablet Take 50 mg by mouth at bedtime.        Marland Kitchen venlafaxine (EFFEXOR-XR) 37.5 MG 24 hr capsule Take 37.5 mg by mouth daily.        Marland Kitchen venlafaxine (EFFEXOR-XR) 75 MG 24 hr capsule Take 75 mg by mouth daily.        Marland Kitchen acetaminophen (TYLENOL) 325 MG tablet Take  650 mg by mouth as needed.          Allergies  Allergen Reactions  . Codeine     History   Social History  . Marital Status: Widowed    Spouse Name: N/A    Number of Children: N/A  . Years of Education: N/A   Occupational History  . Not on file.   Social History Main Topics  . Smoking status: Never Smoker   . Smokeless tobacco: Not on file  . Alcohol Use: No  . Drug Use: No  . Sexually Active: Not on file   Other Topics Concern  . Not on file   Social History Narrative   The patient lives alone. Still works at [employer] in the [employer]. She is a widow. She does have chikdren. She does not smoke cigarettes and she does not drink alcohol     Physical Exam: Filed Vitals:   03/22/12 0946  BP: 122/68  Pulse: 71  Resp: 18  Height: 5\' 2"  (1.575 m)  Weight: 142 lb (64.411 kg)  SpO2: 94%    GEN- The patient is well appearing, alert and oriented x 3 today.   Head- normocephalic, atraumatic Eyes-  Sclera clear, conjunctiva pink Ears- hearing intact Oropharynx- clear Neck- supple, no JVP Lymph- no cervical  lymphadenopathy Lungs- Clear to ausculation bilaterally, normal work of breathing Chest- pacemaker pocket is well healed Heart- Regular rate and rhythm, no murmurs, rubs or gallops, PMI not laterally displaced GI- soft, NT, ND, + BS Extremities- no clubbing, cyanosis, or edema MS- no significant deformity or atrophy  Pacemaker interrogation- reviewed in detail today,  See PACEART report  Assessment and Plan:

## 2012-03-22 NOTE — Assessment & Plan Note (Signed)
EF has normalized

## 2012-03-22 NOTE — Assessment & Plan Note (Signed)
Normal biventricular pacemaker function See Pace Art report No changes today  

## 2012-05-03 ENCOUNTER — Other Ambulatory Visit: Payer: Self-pay | Admitting: Cardiology

## 2012-05-03 NOTE — Telephone Encounter (Signed)
refills  

## 2012-05-04 ENCOUNTER — Other Ambulatory Visit: Payer: Self-pay | Admitting: Internal Medicine

## 2012-05-04 DIAGNOSIS — Z1231 Encounter for screening mammogram for malignant neoplasm of breast: Secondary | ICD-10-CM

## 2012-06-07 ENCOUNTER — Ambulatory Visit
Admission: RE | Admit: 2012-06-07 | Discharge: 2012-06-07 | Disposition: A | Discharge status: Home / Self Care | Payer: Medicare HMO | Source: Ambulatory Visit | Attending: Internal Medicine | Admitting: Internal Medicine

## 2012-06-07 DIAGNOSIS — Z1231 Encounter for screening mammogram for malignant neoplasm of breast: Secondary | ICD-10-CM

## 2012-06-28 ENCOUNTER — Encounter: Payer: Self-pay | Admitting: *Deleted

## 2012-06-28 ENCOUNTER — Telehealth: Payer: Self-pay | Admitting: Cardiology

## 2012-06-28 ENCOUNTER — Encounter: Payer: Self-pay | Admitting: Internal Medicine

## 2012-06-28 ENCOUNTER — Ambulatory Visit (INDEPENDENT_AMBULATORY_CARE_PROVIDER_SITE_OTHER): Payer: Medicare HMO | Admitting: *Deleted

## 2012-06-28 DIAGNOSIS — I441 Atrioventricular block, second degree: Secondary | ICD-10-CM

## 2012-06-28 DIAGNOSIS — Z950 Presence of cardiac pacemaker: Secondary | ICD-10-CM

## 2012-06-28 LAB — REMOTE PACEMAKER DEVICE
AL AMPLITUDE: 0.8 mv
BAMS-0003: 70 {beats}/min
DEVICE MODEL PM: 2648000
LV LEAD IMPEDENCE PM: 800 Ohm
RV LEAD AMPLITUDE: 12 mv

## 2012-06-28 NOTE — Telephone Encounter (Signed)
plz return call to patient regarding patient device questions

## 2012-06-28 NOTE — Telephone Encounter (Signed)
Spoke w/pt in regards to sending transmissions. Instructed pt that transmission was received. Pt was confused about having to send but has a wireless device. Pt does not need to do anything with sending transmission as long as it is scheduled.

## 2012-07-01 ENCOUNTER — Encounter: Payer: Self-pay | Admitting: Cardiology

## 2012-07-01 ENCOUNTER — Ambulatory Visit (INDEPENDENT_AMBULATORY_CARE_PROVIDER_SITE_OTHER): Payer: Medicare HMO | Admitting: Cardiology

## 2012-07-01 VITALS — BP 125/73 | Ht 64.0 in | Wt 150.8 lb

## 2012-07-01 DIAGNOSIS — I25810 Atherosclerosis of coronary artery bypass graft(s) without angina pectoris: Secondary | ICD-10-CM

## 2012-07-01 NOTE — Progress Notes (Signed)
HPI The patient presents for follow Takatsubo's cardiomyopathy and  second-degree heart block she done quite well. She did have a followup echocardiogram demonstrating EF to be 60%. She is not having any palpitations and has had no further presyncope or syncope. She has had no chest pressure, neck or arm discomfort. She has had no shortness of breath, PND or orthopnea. She has had no weight gain or edema. She is active and still works. Unfortunately her son died in 02-12-23 and she has been trying to cope with this.    Allergies  Allergen Reactions  . Codeine     Current Outpatient Prescriptions  Medication Sig Dispense Refill  . acetaminophen (TYLENOL) 325 MG tablet Take 650 mg by mouth as needed.        Marland Kitchen aspirin 81 MG tablet Take 81 mg by mouth daily.        Marland Kitchen atorvastatin (LIPITOR) 10 MG tablet Take 5 mg by mouth daily.       . Calcium Carbonate-Vitamin D (CALCIUM 600 + D PO) Take by mouth 2 (two) times daily.       . carvedilol (COREG) 3.125 MG tablet TAKE 1 TABLET BY MOUTH TWICE A DAY WITH A MEAL  60 tablet  6  . diazepam (VALIUM) 5 MG tablet Take 5 mg by mouth daily.       . ferrous sulfate (CVS IRON) 325 (65 FE) MG tablet Take 325 mg by mouth daily with breakfast.       . fish oil-omega-3 fatty acids 1000 MG capsule Take 2 g by mouth 2 (two) times daily.       Marland Kitchen gabapentin (NEURONTIN) 100 MG capsule       . lisinopril-hydrochlorothiazide (PRINZIDE,ZESTORETIC) 20-12.5 MG per tablet Take 1 tablet by mouth daily.        . multivitamin-iron-minerals-folic acid (CENTRUM) chewable tablet Chew 1 tablet by mouth daily.        . Sennosides (CVS SENNA-C PO) Take by mouth as directed.        . traZODone (DESYREL) 50 MG tablet Take 50 mg by mouth at bedtime.        Marland Kitchen venlafaxine (EFFEXOR-XR) 37.5 MG 24 hr capsule Take 37.5 mg by mouth daily.        Marland Kitchen venlafaxine (EFFEXOR-XR) 75 MG 24 hr capsule Take 75 mg by mouth daily.          Past Medical History  Diagnosis Date  . Hemorrhoids   .  Hypertension   . Anemia   . Diverticulitis   . Hyperlipidemia   . Depression   . Anxiety   . CAD (coronary artery disease)      a. NSTEMI 7/12 with cath: dLM 20%, mLAD 60%, dLAD 30%, Dx 40%, mCFX at AV groove 30%, prox to mid RCA 30-40%, PDA 50%, EF 35% with apical ballooning and DK of the apex;      . Takotsubo cardiomyopathy     a. echo 7/12:  EF 35%, inf-sep, AS and inf HK, mild LVH, mild MR;  b. echo 10/12: EF 60%, mild LAE  . Syncope     2/2 mobitz 2  . Mobitz type 2 second degree atrioventricular block   . LBBB (left bundle branch block)   . Biventricular cardiac pacemaker in situ     St. Jude    Past Surgical History  Procedure Date  . Carpal tunnel release   . Inguinal hernia repair   . Splenectomy   . Cholecystectomy   .  Appendectomy   . Tubal ligation   . Pacemaker insertion     SJM BiV pacemaker implanted 7/12 by Dr Allred    ROS:  Back pain. Otherwise as stated in the HPI and negative for all other systems.  PHYSICAL EXAM There were no vitals taken for this visit. GENERAL:  Well appearing HEENT:  Pupils equal round and reactive, fundi not visualized, oral mucosa unremarkable NECK:  No jugular venous distention, waveform within normal limits, carotid upstroke brisk and symmetric, no bruits, no thyromegaly LUNGS:  Clear to auscultation bilaterally CHEST:  Pacemaker pocket well healed. HEART:  PMI not displaced or sustained,S1 and S2 within normal limits, no S3, no S4, no clicks, no rubs, no murmurs ABD:  Flat, positive bowel sounds normal in frequency in pitch, no bruits, no rebound, no guarding, no midline pulsatile mass, no hepatomegaly, no splenomegaly EXT:  2 plus pulses throughout, no edema, no cyanosis no clubbing   EKG:  NSR rate 70 with ventricular pacing 07/01/2012   ASSESSMENT AND PLAN  CAD (coronary artery disease) -  She had non obstructive CAD and she will continue with risk reduction.  Takotsubo cardiomyopathy -  Her EF is back to normal.  She will continue with meds as listed.  Hypertension -  The blood pressure is at target. No change in medications is indicated. We will continue with therapeutic lifestyle changes (TLC).  Permanent pacemaker - She is up to date with follow up.

## 2012-07-01 NOTE — Patient Instructions (Addendum)
The current medical regimen is effective;  continue present plan and medications.  Follow up in 1 year with Dr Hochrein.  You will receive a letter in the mail 2 months before you are due.  Please call us when you receive this letter to schedule your follow up appointment.  

## 2012-07-05 ENCOUNTER — Encounter: Payer: Self-pay | Admitting: *Deleted

## 2012-09-06 ENCOUNTER — Telehealth: Payer: Self-pay | Admitting: Internal Medicine

## 2012-09-06 NOTE — Telephone Encounter (Signed)
Pt had a dizzy spell and this morning and she is still dizzy and wants to know what she needs to do because her neighbor is with her because she dose not feel safe to drive

## 2012-09-06 NOTE — Telephone Encounter (Signed)
Spoke with patient and she has been having dizziness since yesterday.   Had a bad day yesterday with her nerves and "screamed and cried all day"  She says she has been dizzy since yesterday.  She does not have any pain denies any SOB and does not have any other symptom other than just being dizzy.  She has called her medical MD and they are going to see her in the morning at 9am  I have requested for them to see her today but don't know if they are going to be able to do so. The nurse is going to call me back.  I let the patient know that if she feels she can not wait until tomorrow to be seen then she can go to the Urgent care or ER to be seen

## 2012-09-07 NOTE — Telephone Encounter (Signed)
She went to the MD today at 9am.  She saw the PA today and she has a real bad case of depression.

## 2012-09-22 ENCOUNTER — Ambulatory Visit (INDEPENDENT_AMBULATORY_CARE_PROVIDER_SITE_OTHER): Payer: Medicare HMO | Admitting: Ophthalmology

## 2012-09-26 ENCOUNTER — Encounter (HOSPITAL_COMMUNITY): Payer: Self-pay | Admitting: Emergency Medicine

## 2012-09-26 ENCOUNTER — Emergency Department (HOSPITAL_COMMUNITY)
Admission: EM | Admit: 2012-09-26 | Discharge: 2012-09-27 | Disposition: A | Discharge status: Home / Self Care | Payer: Medicare HMO | Attending: Emergency Medicine | Admitting: Emergency Medicine

## 2012-09-26 ENCOUNTER — Emergency Department (HOSPITAL_COMMUNITY): Payer: Medicare HMO

## 2012-09-26 ENCOUNTER — Other Ambulatory Visit: Payer: Self-pay

## 2012-09-26 DIAGNOSIS — F329 Major depressive disorder, single episode, unspecified: Secondary | ICD-10-CM | POA: Insufficient documentation

## 2012-09-26 DIAGNOSIS — I2510 Atherosclerotic heart disease of native coronary artery without angina pectoris: Secondary | ICD-10-CM | POA: Insufficient documentation

## 2012-09-26 DIAGNOSIS — Z8679 Personal history of other diseases of the circulatory system: Secondary | ICD-10-CM | POA: Insufficient documentation

## 2012-09-26 DIAGNOSIS — E785 Hyperlipidemia, unspecified: Secondary | ICD-10-CM | POA: Insufficient documentation

## 2012-09-26 DIAGNOSIS — I441 Atrioventricular block, second degree: Secondary | ICD-10-CM | POA: Insufficient documentation

## 2012-09-26 DIAGNOSIS — T82190A Other mechanical complication of cardiac electrode, initial encounter: Secondary | ICD-10-CM | POA: Insufficient documentation

## 2012-09-26 DIAGNOSIS — Z7982 Long term (current) use of aspirin: Secondary | ICD-10-CM | POA: Insufficient documentation

## 2012-09-26 DIAGNOSIS — I447 Left bundle-branch block, unspecified: Secondary | ICD-10-CM | POA: Insufficient documentation

## 2012-09-26 DIAGNOSIS — R509 Fever, unspecified: Secondary | ICD-10-CM | POA: Insufficient documentation

## 2012-09-26 DIAGNOSIS — I10 Essential (primary) hypertension: Secondary | ICD-10-CM | POA: Insufficient documentation

## 2012-09-26 DIAGNOSIS — Y831 Surgical operation with implant of artificial internal device as the cause of abnormal reaction of the patient, or of later complication, without mention of misadventure at the time of the procedure: Secondary | ICD-10-CM | POA: Insufficient documentation

## 2012-09-26 DIAGNOSIS — T829XXA Unspecified complication of cardiac and vascular prosthetic device, implant and graft, initial encounter: Secondary | ICD-10-CM

## 2012-09-26 DIAGNOSIS — F411 Generalized anxiety disorder: Secondary | ICD-10-CM | POA: Insufficient documentation

## 2012-09-26 DIAGNOSIS — I5181 Takotsubo syndrome: Secondary | ICD-10-CM | POA: Insufficient documentation

## 2012-09-26 DIAGNOSIS — Z79899 Other long term (current) drug therapy: Secondary | ICD-10-CM | POA: Insufficient documentation

## 2012-09-26 DIAGNOSIS — F3289 Other specified depressive episodes: Secondary | ICD-10-CM | POA: Insufficient documentation

## 2012-09-26 DIAGNOSIS — D649 Anemia, unspecified: Secondary | ICD-10-CM | POA: Insufficient documentation

## 2012-09-26 DIAGNOSIS — Z8719 Personal history of other diseases of the digestive system: Secondary | ICD-10-CM | POA: Insufficient documentation

## 2012-09-26 LAB — URINALYSIS, ROUTINE W REFLEX MICROSCOPIC
Bilirubin Urine: NEGATIVE
Glucose, UA: NEGATIVE mg/dL
Protein, ur: NEGATIVE mg/dL

## 2012-09-26 LAB — CBC WITH DIFFERENTIAL/PLATELET
Eosinophils Absolute: 0 10*3/uL (ref 0.0–0.7)
Eosinophils Relative: 0 % (ref 0–5)
HCT: 34.1 % — ABNORMAL LOW (ref 36.0–46.0)
Hemoglobin: 11.7 g/dL — ABNORMAL LOW (ref 12.0–15.0)
Lymphocytes Relative: 12 % (ref 12–46)
Lymphs Abs: 3.1 10*3/uL (ref 0.7–4.0)
MCH: 32.1 pg (ref 26.0–34.0)
MCV: 93.7 fL (ref 78.0–100.0)
Monocytes Absolute: 3.1 10*3/uL — ABNORMAL HIGH (ref 0.1–1.0)
Monocytes Relative: 12 % (ref 3–12)
Platelets: 223 10*3/uL (ref 150–400)
RBC: 3.64 MIL/uL — ABNORMAL LOW (ref 3.87–5.11)
WBC: 27 10*3/uL — ABNORMAL HIGH (ref 4.0–10.5)

## 2012-09-26 LAB — BASIC METABOLIC PANEL
BUN: 13 mg/dL (ref 6–23)
CO2: 25 mEq/L (ref 19–32)
Calcium: 9.4 mg/dL (ref 8.4–10.5)
Glucose, Bld: 134 mg/dL — ABNORMAL HIGH (ref 70–99)
Sodium: 128 mEq/L — ABNORMAL LOW (ref 135–145)

## 2012-09-26 LAB — URINE MICROSCOPIC-ADD ON

## 2012-09-26 MED ORDER — SODIUM CHLORIDE 0.9 % IV BOLUS (SEPSIS)
1000.0000 mL | Freq: Once | INTRAVENOUS | Status: AC
  Administered 2012-09-26: 1000 mL via INTRAVENOUS

## 2012-09-26 MED ORDER — ACETAMINOPHEN 325 MG PO TABS
650.0000 mg | ORAL_TABLET | Freq: Once | ORAL | Status: AC
  Administered 2012-09-26: 650 mg via ORAL
  Filled 2012-09-26: qty 2

## 2012-09-26 NOTE — ED Notes (Signed)
Pt undressed, in gown, on monitor, continuous pulse oximetry and blood pressure cuff 

## 2012-09-26 NOTE — ED Notes (Signed)
Jerking noted in pt legs and abdomen, similar to heart rate rhythm, pt denies any pain with jerking sensation.

## 2012-09-26 NOTE — ED Notes (Addendum)
Pt brought to ED via [facility] for evaluation of jerking sensation in abdomen and legs as well as dizziness.  Pt became dizzy around 1pm, went home took Effexor and Neurontin for "pinched nerve in back".  Upon arrival to ED temp found to be 103.2, denies feeling sick.  Pt in paced rhythm - 96bpm, pacemaker placed about a year ago and had similar jerking sensation and had to have pacemaker readjusted.  Placed on cardiac monitor.

## 2012-09-26 NOTE — ED Notes (Signed)
Eileen Stanford, RN present when temperature was taken

## 2012-09-26 NOTE — ED Provider Notes (Signed)
History     CSN: [id number]  Arrival date & time 09/26/12  4098   First MD Initiated Contact with Patient 09/26/12 1853      Chief Complaint  Patient presents with  . Dizziness    (Consider location/radiation/quality/duration/timing/severity/associated sxs/prior treatment) Patient is a 79 y.o. female presenting with general illness. The history is provided by the patient.  Illness  The current episode started today. The onset was gradual. The problem occurs frequently. The problem has been unchanged. The problem is moderate. Nothing relieves the symptoms. Nothing aggravates the symptoms. Associated symptoms include a fever. Pertinent negatives include no orthopnea, no photophobia, no abdominal pain, no diarrhea, no nausea, no vomiting, no congestion, no headaches, no rhinorrhea, no sore throat, no muscle aches, no cough, no URI, no wheezing and no rash. She has been behaving normally. She has been eating and drinking normally. Urine output has been normal. There were no sick contacts. She has received no recent medical care.    Past Medical History  Diagnosis Date  . Hemorrhoids   . Hypertension   . Anemia   . Diverticulitis   . Hyperlipidemia   . Depression   . Anxiety   . CAD (coronary artery disease)      a. NSTEMI 7/12 with cath: dLM 20%, mLAD 60%, dLAD 30%, Dx 40%, mCFX at AV groove 30%, prox to mid RCA 30-40%, PDA 50%, EF 35% with apical ballooning and DK of the apex;      . Takotsubo cardiomyopathy     a. echo 7/12:  EF 35%, inf-sep, AS and inf HK, mild LVH, mild MR;  b. echo 10/12: EF 60%, mild LAE  . Syncope     2/2 mobitz 2  . Mobitz type 2 second degree atrioventricular block   . LBBB (left bundle branch block)   . Biventricular cardiac pacemaker in situ     St. Jude    Past Surgical History  Procedure Date  . Carpal tunnel release   . Inguinal hernia repair   . Splenectomy   . Cholecystectomy   . Appendectomy   . Tubal ligation   . Pacemaker insertion    SJM BiV pacemaker implanted 7/12 by Dr Johney Frame    Family History  Problem Relation Age of Onset  . Coronary artery disease Neg Hx     History  Substance Use Topics  . Smoking status: Never Smoker   . Smokeless tobacco: Not on file  . Alcohol Use: No    OB History    Grav Para Term Preterm Abortions TAB SAB Ect Mult Living                  Review of Systems  Constitutional: Positive for fever. Negative for fatigue.  HENT: Negative for congestion, sore throat, rhinorrhea and postnasal drip.   Eyes: Negative for photophobia and visual disturbance.  Respiratory: Negative for cough, chest tightness, shortness of breath and wheezing.   Cardiovascular: Negative for chest pain, palpitations, orthopnea and leg swelling.  Gastrointestinal: Negative for nausea, vomiting, abdominal pain and diarrhea.  Genitourinary: Negative for urgency, frequency and difficulty urinating.  Musculoskeletal: Negative for back pain and arthralgias.  Skin: Negative for rash and wound.  Neurological: Negative for weakness and headaches.       Twitching  Psychiatric/Behavioral: Negative for confusion and agitation.    Allergies  Codeine  Home Medications   Current Outpatient Rx  Name  Route  Sig  Dispense  Refill  . ACETAMINOPHEN 325 MG PO  TABS   Oral   Take 650 mg by mouth as needed.           . ASPIRIN 81 MG PO TABS   Oral   Take 81 mg by mouth daily.           . ATORVASTATIN CALCIUM 10 MG PO TABS   Oral   Take 5 mg by mouth daily.          Marland Kitchen CALCIUM 600 + D PO   Oral   Take by mouth 2 (two) times daily.          Marland Kitchen CARVEDILOL 3.125 MG PO TABS   Oral   Take 3.125 mg by mouth 2 (two) times daily with a meal.         . DIAZEPAM 5 MG PO TABS   Oral   Take 5 mg by mouth daily.          Marland Kitchen FERROUS SULFATE 325 (65 FE) MG PO TABS   Oral   Take 325 mg by mouth daily with breakfast.          . OMEGA-3 FATTY ACIDS 1000 MG PO CAPS   Oral   Take 2 g by mouth 2 (two) times  daily.          Marland Kitchen GABAPENTIN 100 MG PO CAPS   Oral   Take 100 mg by mouth 3 (three) times daily.          Marland Kitchen LISINOPRIL-HYDROCHLOROTHIAZIDE 20-12.5 MG PO TABS   Oral   Take 1 tablet by mouth daily.           . CENTRUM PO CHEW   Oral   Chew 1 tablet by mouth daily.           . CVS SENNA-C PO   Oral   Take by mouth as directed.           . TRAZODONE HCL 50 MG PO TABS   Oral   Take 50 mg by mouth at bedtime.           . VENLAFAXINE HCL ER 75 MG PO CP24   Oral   Take 150 mg by mouth daily.            BP 121/84  Pulse 78  Temp 99.5 F (37.5 C) (Oral)  Resp 17  SpO2 96%  Physical Exam  Nursing note and vitals reviewed. Constitutional: She is oriented to person, place, and time. She appears well-developed and well-nourished. No distress.  HENT:  Head: Normocephalic and atraumatic.  Mouth/Throat: Oropharynx is clear and moist.  Eyes: EOM are normal. Pupils are equal, round, and reactive to light.  Neck: Normal range of motion. Neck supple.  Cardiovascular: Normal rate, regular rhythm, normal heart sounds and intact distal pulses.   Pulmonary/Chest: Effort normal and breath sounds normal. She has no wheezes. She has no rales.  Abdominal: Soft. Bowel sounds are normal. She exhibits no distension. There is no tenderness. There is no rebound and no guarding.  Musculoskeletal: Normal range of motion. She exhibits no edema and no tenderness.  Lymphadenopathy:    She has no cervical adenopathy.  Neurological: She is alert and oriented to person, place, and time. She displays normal reflexes. No cranial nerve deficit. She exhibits normal muscle tone. Coordination normal.       Lower extremity and abdominal wall muscle twitching. 5/5 muscle strength throughout. Normal sensation. Normal reflexes.  Skin: Skin is warm and dry. No rash noted.  Psychiatric: She has a normal mood and affect. Her behavior is normal.    ED Course  Procedures (including critical care  time)   Date: 09/26/2012  Rate: 93  Rhythm: ventricular paced rhythm  QRS Axis: left  Intervals: PR prolonged  ST/T Wave abnormalities: nonspecific ST changes  Conduction Disutrbances:none  Narrative Interpretation:   Old EKG Reviewed: unchanged    Labs Reviewed  CBC WITH DIFFERENTIAL - Abnormal; Notable for the following:    WBC 27.0 (*)     RBC 3.64 (*)     Hemoglobin 11.7 (*)     HCT 34.1 (*)     Neutro Abs 20.7 (*)     Monocytes Absolute 3.1 (*)     All other components within normal limits  BASIC METABOLIC PANEL - Abnormal; Notable for the following:    Sodium 128 (*)     Potassium 3.4 (*)     Chloride 94 (*)     Glucose, Bld 134 (*)     GFR calc non Af Amer 62 (*)     GFR calc Af Amer 71 (*)     All other components within normal limits  URINALYSIS, ROUTINE W REFLEX MICROSCOPIC - Abnormal; Notable for the following:    Hgb urine dipstick LARGE (*)     All other components within normal limits  URINE MICROSCOPIC-ADD ON - Abnormal; Notable for the following:    Bacteria, UA FEW (*)     All other components within normal limits  GRAM STAIN   Dg Chest 2 View  09/26/2012  *RADIOLOGY REPORT*  Clinical Data: Dizziness.  CHEST - 2 VIEW  Comparison: 03/19/2011.  Findings: The cardiac silhouette, mediastinal and hilar contours are within normal limits and stable.  Pacer wires are stable.  The lungs are clear.  No pleural effusion.  The bony thorax is intact.  IMPRESSION: No acute cardiopulmonary findings.   Original Report Authenticated By: Rudie Meyer, M.D.      1. Fever   2. Pacemaker complications       MDM  29F with pmhx of Mobitz Type 2 heart block and LBBB s/p pacemaker placed in 2012, HTN, and CAD presents with generalized fatigue/weakness since this morning along with lower extremity twitching. Twitching has occurred twice in the past. First was the day after her pacemaker was placed and then several months later. It was "fixed" by the cardiology clinic each  time. On arrival to ED, she is febrile to 103 but denies cough, rhinorrhea, muscle aches. Has some mild left lower abdominal pain from where the twitching is. No sick contacts. Vital signs are stable. Normotensive. Not tachycardic and non-toxic appearing. Will interrogate pacemaker here (has St. Jude). Will also evaluate for cause of fever. Unsure if these 2 symptoms are related. Will obtain CBC, BMP, CXR, and urine studies. Given IVF and Tylenol for symptomatic control.  Pt feeling better. No longer any twitching. Labs reveal leukocytosis but CXR and urine not c/w infection. No flu-like illnesses at this time. Pt remains well-appearing, non-toxic, and well-hydrated. Doubt serious bacterial illness at this time. Attempted to interrogate the pacemaker but was informed that we would not be able to pick up phrenic nerve stimulation via interrogation. Since her symptoms have resolved, she will follow up with EP. Return precautions given. Stable for d/c home.        Johnnette Gourd, MD 09/26/12 [phone]

## 2012-09-26 NOTE — ED Notes (Signed)
Pt family informed that they will have to swap places for visitors.

## 2012-09-27 ENCOUNTER — Telehealth: Payer: Self-pay | Admitting: Internal Medicine

## 2012-09-27 NOTE — Telephone Encounter (Signed)
LMOM 1350/kwm to schedule appt for device clinic.

## 2012-09-27 NOTE — Telephone Encounter (Signed)
Pt went to ER for diaphramatic stim and needs ppm reprogrammed. LMOM for pt to return call to schedule appt/kwm

## 2012-09-27 NOTE — Telephone Encounter (Signed)
New problem:   Went to emergency last night  Was told be seen by Dr. Johney Frame due to pacemaker issues.

## 2012-09-28 NOTE — Telephone Encounter (Signed)
[name] to schedule for device check

## 2012-09-30 ENCOUNTER — Encounter: Payer: Self-pay | Admitting: Cardiology

## 2012-09-30 ENCOUNTER — Ambulatory Visit (INDEPENDENT_AMBULATORY_CARE_PROVIDER_SITE_OTHER): Payer: Medicare HMO | Admitting: Ophthalmology

## 2012-09-30 ENCOUNTER — Ambulatory Visit (INDEPENDENT_AMBULATORY_CARE_PROVIDER_SITE_OTHER): Payer: Medicare HMO | Admitting: Cardiology

## 2012-09-30 ENCOUNTER — Encounter: Payer: Self-pay | Admitting: Internal Medicine

## 2012-09-30 VITALS — BP 130/77 | HR 86 | Ht 64.0 in | Wt 142.0 lb

## 2012-09-30 DIAGNOSIS — I10 Essential (primary) hypertension: Secondary | ICD-10-CM

## 2012-09-30 DIAGNOSIS — I441 Atrioventricular block, second degree: Secondary | ICD-10-CM

## 2012-09-30 DIAGNOSIS — H2510 Age-related nuclear cataract, unspecified eye: Secondary | ICD-10-CM

## 2012-09-30 DIAGNOSIS — H43819 Vitreous degeneration, unspecified eye: Secondary | ICD-10-CM

## 2012-09-30 DIAGNOSIS — H35039 Hypertensive retinopathy, unspecified eye: Secondary | ICD-10-CM

## 2012-09-30 DIAGNOSIS — I5181 Takotsubo syndrome: Secondary | ICD-10-CM

## 2012-09-30 DIAGNOSIS — Z45018 Encounter for adjustment and management of other part of cardiac pacemaker: Secondary | ICD-10-CM

## 2012-09-30 DIAGNOSIS — E11319 Type 2 diabetes mellitus with unspecified diabetic retinopathy without macular edema: Secondary | ICD-10-CM

## 2012-09-30 DIAGNOSIS — Z950 Presence of cardiac pacemaker: Secondary | ICD-10-CM

## 2012-09-30 DIAGNOSIS — H3530 Unspecified macular degeneration: Secondary | ICD-10-CM

## 2012-09-30 DIAGNOSIS — E1039 Type 1 diabetes mellitus with other diabetic ophthalmic complication: Secondary | ICD-10-CM

## 2012-09-30 NOTE — Patient Instructions (Addendum)
Your physician wants you to follow-up in: 6 months with Dr Jacquiline Doe will receive a reminder letter in the mail two months in advance. If you don't receive a letter, please call our office to schedule the follow-up appointment.  Remote monitoring is used to monitor your Pacemaker of ICD from home. This monitoring reduces the number of office visits required to check your device to one time per year. It allows [place] to keep an eye on the functioning of your device to ensure it is working properly. You are scheduled for a device check from home on 12/27/12. You may send your transmission at any time that day. If you have a wireless device, the transmission will be sent automatically. After your physician reviews your transmission, you will receive a postcard with your next transmission date.

## 2012-09-30 NOTE — Progress Notes (Signed)
ELECTROPHYSIOLOGY OFFICE NOTE  Patient ID: [name] MRN: [id number], DOB/AGE: 1926/10/25   Date of Visit: 09/30/2012  Primary Physician: [name], MD Primary Cardiologist: Antoine Poche, MD Primary EP: Johney Frame, MD Reason for Visit: EP/device follow-up  History of Present Illness  [name] is a pleasant 79 year old woman with history of Takotsubo cardiomyopathy, Mobitz II AV block s/p BiV PPM implant, nonobstructive CAD by  cath July 2012, HTN and dyslipidemia who presents today for EP follow-up. She was recently evaluated in the ED for dizziness and also reported "thumping" in her lower left chest/flank area while lying her left side. She has been recently diagnosed with vertigo and is now on Antivert. She was instructed to follow-up here for the "thumping."   Today she has no cardiac complaints. She reports she had one episode of "thumping" Sunday morning but has not had any recurrence since then, even with lying on her left side. She denies CP, SOB or palpitations. She has intermittent dizziness but denies near syncope or syncope. She denies LE swelling, orthopnea or PND.   Past Medical History Past Medical History  Diagnosis Date  . Hemorrhoids   . Hypertension   . Anemia   . Diverticulitis   . Hyperlipidemia   . Depression   . Anxiety   . CAD (coronary artery disease)      a. NSTEMI 7/12 with cath: dLM 20%, mLAD 60%, dLAD 30%, Dx 40%, mCFX at AV groove 30%, prox to mid RCA 30-40%, PDA 50%, EF 35% with apical ballooning and DK of the apex;      . Takotsubo cardiomyopathy     a. echo 7/12:  EF 35%, inf-sep, AS and inf HK, mild LVH, mild MR;  b. echo 10/12: EF 60%, mild LAE  . Syncope     2/2 mobitz 2  . Mobitz type 2 second degree atrioventricular block   . LBBB (left bundle branch block)   . Biventricular cardiac pacemaker in situ     St. Jude    Past Surgical History Past Surgical History  Procedure Date  . Carpal tunnel release   . Inguinal hernia  repair   . Splenectomy   . Cholecystectomy   . Appendectomy   . Tubal ligation   . Pacemaker insertion     SJM BiV pacemaker implanted 7/12 by Dr Johney Frame     Allergies/Intolerances Allergies  Allergen Reactions  . Codeine     Current Home Medications Current Outpatient Prescriptions  Medication Sig Dispense Refill  . acetaminophen (TYLENOL) 325 MG tablet Take 650 mg by mouth as needed.        Marland Kitchen aspirin 81 MG tablet Take 81 mg by mouth daily.        Marland Kitchen atorvastatin (LIPITOR) 10 MG tablet Take 5 mg by mouth daily.       . Calcium Carbonate-Vitamin D (CALCIUM 600 + D PO) Take by mouth 2 (two) times daily.       . carvedilol (COREG) 3.125 MG tablet Take 3.125 mg by mouth 2 (two) times daily with a meal.      . diazepam (VALIUM) 5 MG tablet Take 5 mg by mouth daily.       . ferrous sulfate ([employer] IRON) 325 (65 FE) MG tablet Take 325 mg by mouth daily with breakfast.       . fish oil-omega-3 fatty acids 1000 MG capsule Take 2 g by mouth 2 (two) times daily.       Marland Kitchen gabapentin (NEURONTIN) 100  MG capsule Take 100 mg by mouth 3 (three) times daily.       Marland Kitchen lisinopril-hydrochlorothiazide (PRINZIDE,ZESTORETIC) 20-12.5 MG per tablet Take 1 tablet by mouth daily.        . multivitamin-iron-minerals-folic acid (CENTRUM) chewable tablet Chew 1 tablet by mouth daily.        . Sennosides (CVS SENNA-C PO) Take by mouth as directed.        . traZODone (DESYREL) 50 MG tablet Take 50 mg by mouth at bedtime.        Marland Kitchen venlafaxine (EFFEXOR-XR) 75 MG 24 hr capsule Take 150 mg by mouth daily.         Social History Social History  . Marital Status: Widowed   Social History Main Topics  . Smoking status: Never Smoker   . Smokeless tobacco: Never Used  . Alcohol Use: No  . Drug Use: No   Social History Narrative   The patient lives alone. Still works at [employer] in the [employer] section. She is a widow. She does have chikdren. She does not smoke cigarettes and she does not drink alcohol    Review of  Systems General: No chills, fever, night sweats or weight changes Cardiovascular: No chest pain, dyspnea on exertion, edema, orthopnea, palpitations, paroxysmal nocturnal dyspnea Dermatological: No rash, lesions or masses Respiratory: No cough, dyspnea Urologic: No hematuria, dysuria Abdominal: No nausea, vomiting, diarrhea, bright red blood per rectum, melena, or hematemesis Neurologic: No visual changes, weakness, changes in mental status All other systems reviewed and are otherwise negative except as noted above.  Physical Exam Blood pressure 130/77, pulse 86, height 5\' 4"  (1.626 m), weight 142 lb (64.411 kg), SpO2 97.00%.  General: Well developed, well appearing 79 year old female in no acute distress. HEENT: Normocephalic, atraumatic. EOMs intact. Sclera nonicteric. Oropharynx clear.  Neck: Supple. No JVD. Lungs: Respirations regular and unlabored, CTA bilaterally. No wheezes, rales or rhonchi. Heart: RRR. S1, S2 present. No murmurs, rub, S3 or S4. Abdomen: Soft, non-distended. Extremities: No clubbing, cyanosis or edema. DP/PT/Radials 2+ and equal bilaterally. Psych: Normal affect. Neuro: Alert and oriented X 3. Moves all extremities spontaneously.   Diagnostics Device interrogation  CRT-P device check in clinic. Normal device function with good battery status. Thresholds, sensing, impedance consistent with previous measurements. Histograms appropriate for patient and level of activity. 6 AMS episodes, longest 16 minutes 46 seconds on 09/21/2012, with EGMs consistent with an atrial tachycardia/flutter that degenerates into trial fibrillation. AT/AF burden <1% of time. On 01/17 and 01/22, there is evidence of brief atrial tachycardia with 1:1 conduction. No ventricular arrhythmias recorded. Patient bi-ventricularly pacing 99% of the time. Device programmed with appropriate safety margins. Patient did not report "thumping."   Assessment and Plan 1. Paroxysmal atrial tachycardia/atrial  fibrillation Newly documented via device interrogation today Brief, nonsustained <30 minutes in duration, asymptomatic Continue to monitor via device - next remote check in 3 months 2. Mobitz II AV block Stable; BiV PPM in place Normal device function Continue remote follow-up every 3 months Follow-up with Dr. Johney Frame in 6 months 3. Takotsubo cardiomyopathy EF has normalized 4. Hypertension Normotensive today; continue current antihypertensive regimen   Signed, Rick Duff, PA-C 09/30/2012, 4:57 PM

## 2012-09-30 NOTE — ED Provider Notes (Signed)
I saw and evaluated the patient, reviewed the resident's note and I agree with the findings and plan and agree with their ECG interpretation. Patient with fever. X-ray and urine reassuring. Laboratory reassuring. Had some twitching that has resolved. Since it is the whole left side I figured it's less likely phrenic nerve stimulation. She will follow with her electrophysiologist  Juliet Rude. Rubin Payor, MD 09/30/12 1538

## 2012-10-01 ENCOUNTER — Encounter: Payer: Self-pay | Admitting: Cardiology

## 2012-10-01 LAB — PACEMAKER DEVICE OBSERVATION
BAMS-0003: 70 {beats}/min
BATTERY VOLTAGE: 2.95 V
LV LEAD IMPEDENCE PM: 810 Ohm
RV LEAD IMPEDENCE PM: 630 Ohm
VENTRICULAR PACING PM: 99

## 2012-10-05 ENCOUNTER — Encounter: Payer: Self-pay | Admitting: *Deleted

## 2012-12-03 ENCOUNTER — Other Ambulatory Visit: Payer: Self-pay | Admitting: Cardiology

## 2012-12-03 MED ORDER — CARVEDILOL 3.125 MG PO TABS: 3.1250 mg | ORAL_TABLET | Freq: Two times a day (BID) | ORAL | Status: DC

## 2012-12-27 ENCOUNTER — Encounter: Payer: Self-pay | Admitting: Internal Medicine

## 2012-12-27 ENCOUNTER — Ambulatory Visit (INDEPENDENT_AMBULATORY_CARE_PROVIDER_SITE_OTHER): Payer: Medicare HMO | Admitting: *Deleted

## 2012-12-27 ENCOUNTER — Other Ambulatory Visit: Payer: Self-pay | Admitting: Internal Medicine

## 2012-12-27 DIAGNOSIS — Z950 Presence of cardiac pacemaker: Secondary | ICD-10-CM

## 2012-12-27 DIAGNOSIS — I441 Atrioventricular block, second degree: Secondary | ICD-10-CM

## 2012-12-28 LAB — REMOTE PACEMAKER DEVICE
AL AMPLITUDE: 2.4 mv
BAMS-0001: 150 {beats}/min
BAMS-0003: 70 {beats}/min
BRDY-0002RA: 60 {beats}/min
DEVICE MODEL PM: 2648000
LV LEAD IMPEDENCE PM: 840 Ohm
RV LEAD IMPEDENCE PM: 640 Ohm

## 2013-01-21 ENCOUNTER — Encounter: Payer: Self-pay | Admitting: *Deleted

## 2013-02-07 ENCOUNTER — Other Ambulatory Visit: Payer: Self-pay | Admitting: Orthopedic Surgery

## 2013-02-11 ENCOUNTER — Encounter (HOSPITAL_BASED_OUTPATIENT_CLINIC_OR_DEPARTMENT_OTHER): Payer: Self-pay | Admitting: *Deleted

## 2013-02-11 NOTE — Progress Notes (Signed)
Pt still works part time [employer] and cares for herself-to come in for bmet 6/11-ekg done 1/14-pacemaker form to dr hochieren

## 2013-02-14 IMAGING — CT CT HEAD W/O CM
1 series · 16 of 30 positions shown, 20 images · non-contrast
Comparison: 06/30/2005

CLINICAL DATA: Dizziness and syncope.

CT HEAD WITHOUT CONTRAST
TECHNIQUE: Contiguous axial images were obtained from the base of
the skull through the vertex without contrast.

[Series 2: head 4.8 h37s · axial · 0.45mm/px · z∈[-142,-9]mm · 16 of 32 slices shown, 20 images]
[im 2/32  brain]
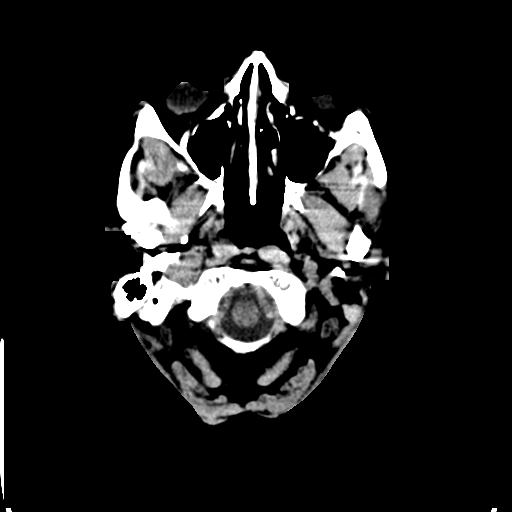
[im 2/32  bone]
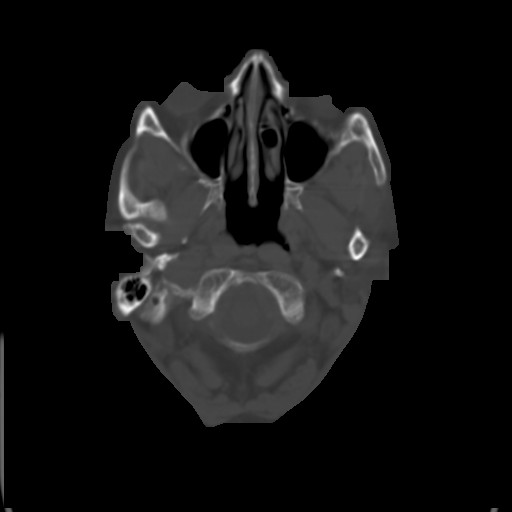
[im 4/32  brain]
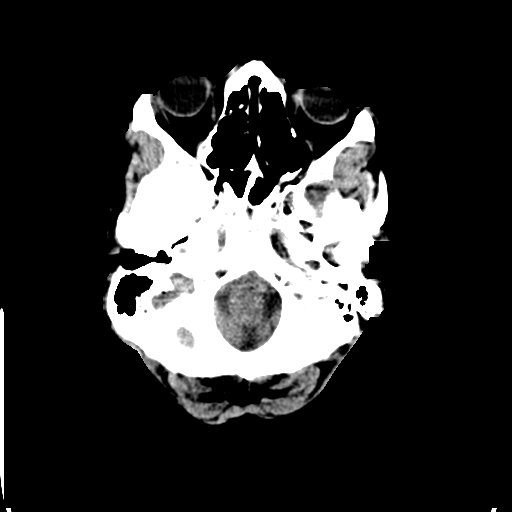
[im 6/32  brain]
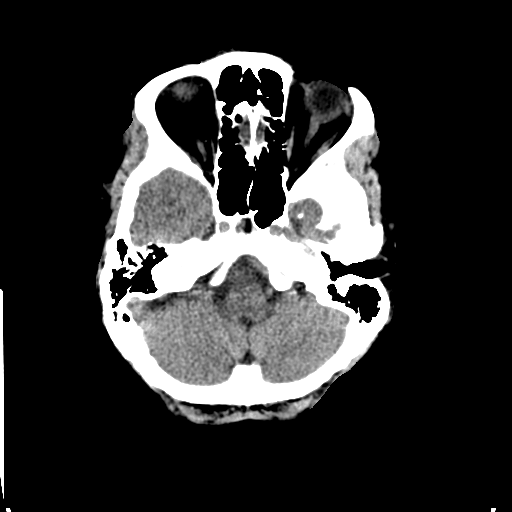
[im 8/32  brain]
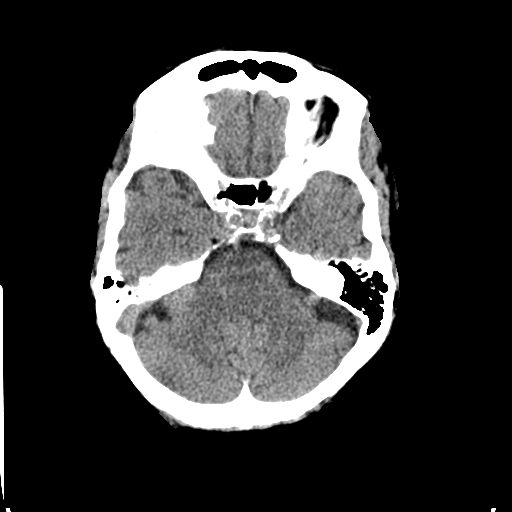
[im 9/32  brain]
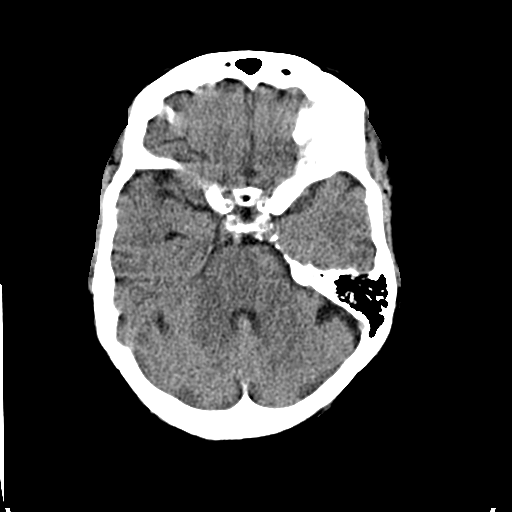
[im 9/32  bone]
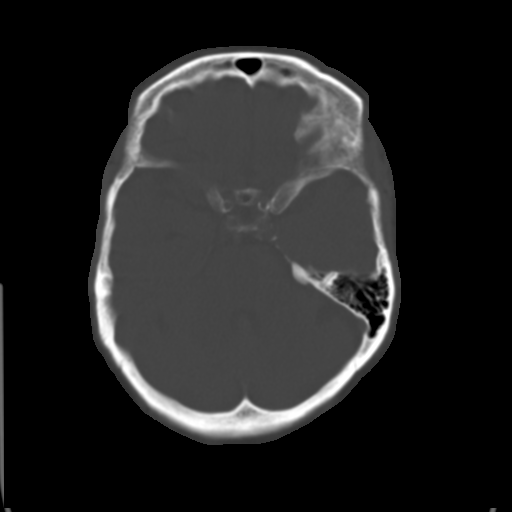
[im 11/32  brain]
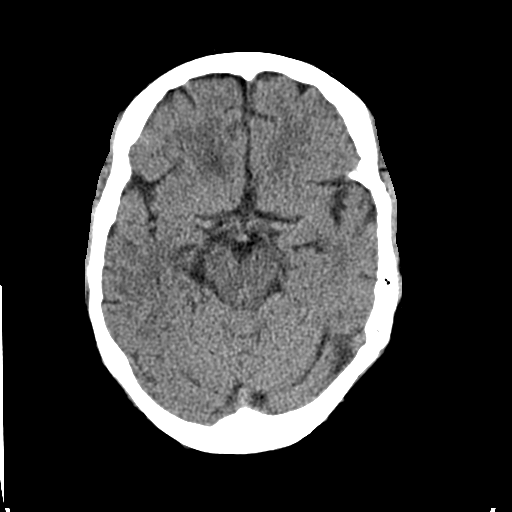
[im 13/32  brain]
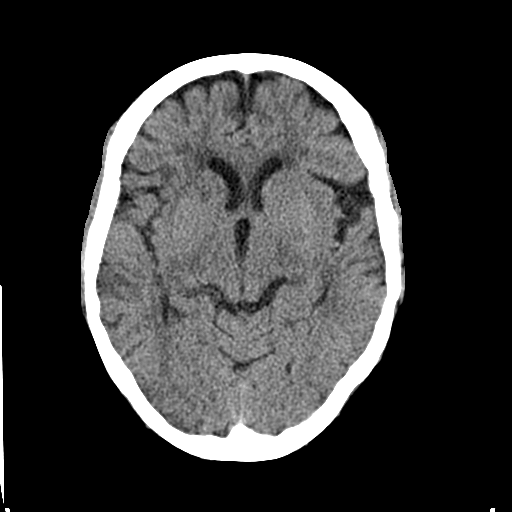
[im 15/32  brain]
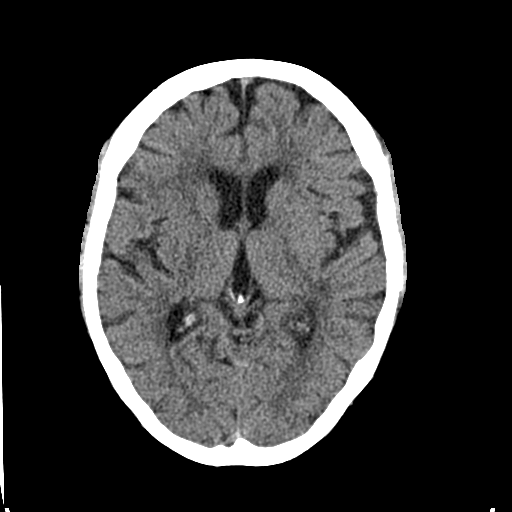
[im 17/32  brain]
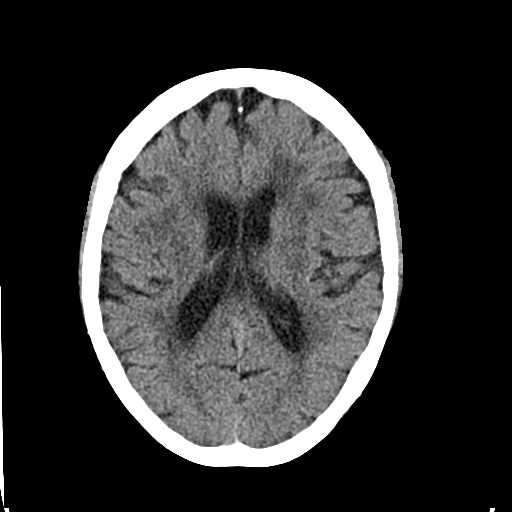
[im 17/32  bone]
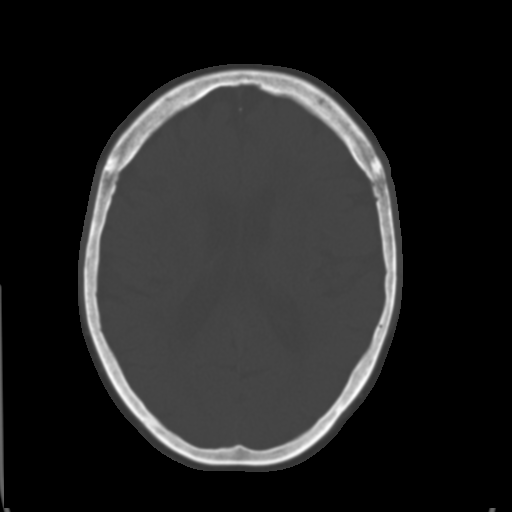
[im 19/32  brain]
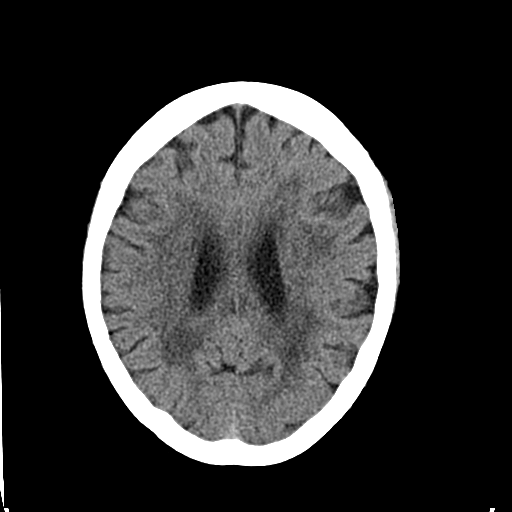
[im 21/32  brain]
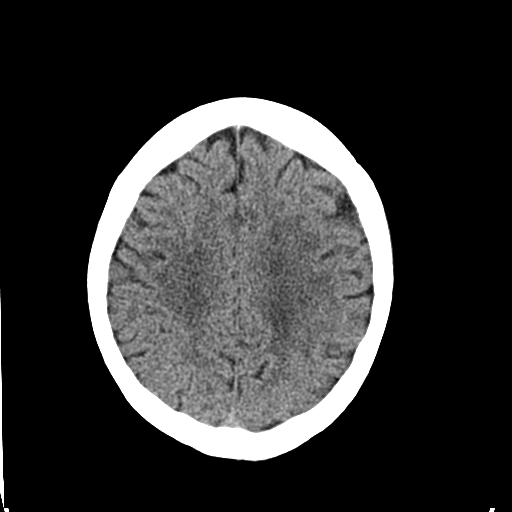
[im 23/32  brain]
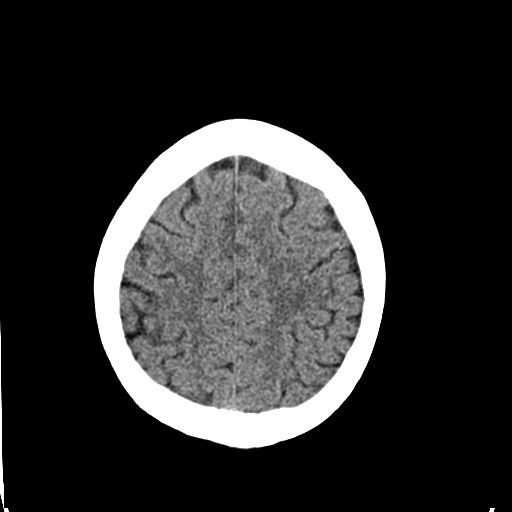
[im 24/32  brain]
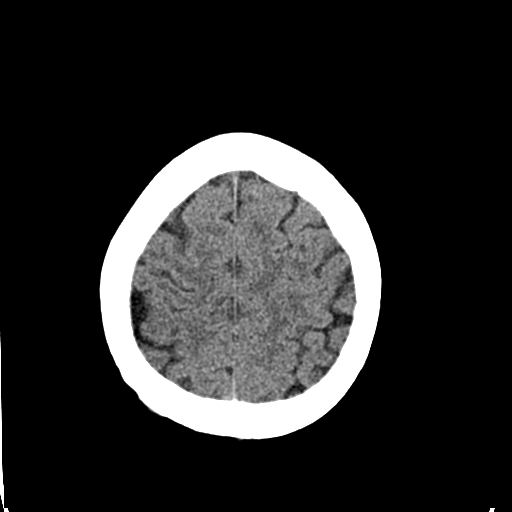
[im 24/32  bone]
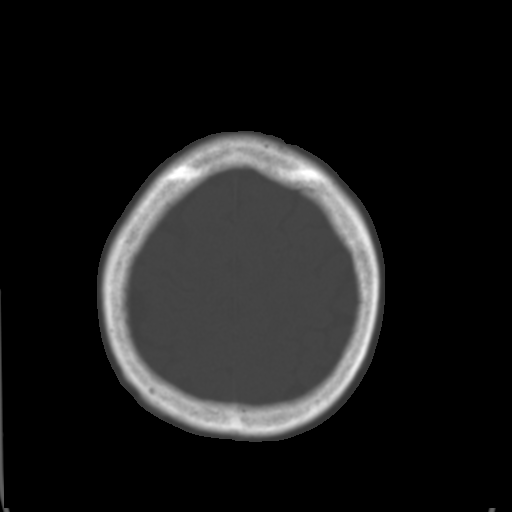
[im 26/32  brain]
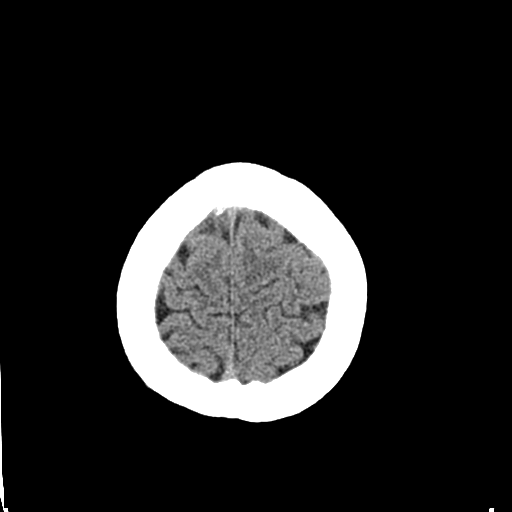
[im 28/32  brain]
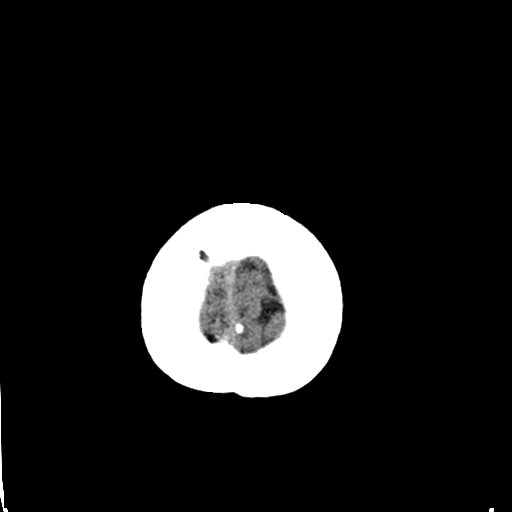
[im 30/32  brain]
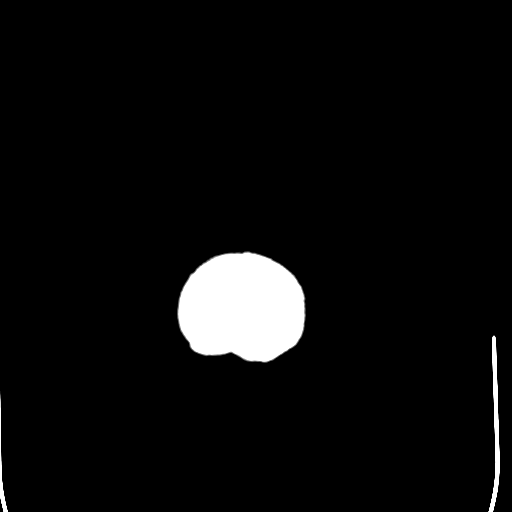

[16 of 30 positions shown; findings below may reference images not displayed]

FINDINGS: Mild diffuse cerebral atrophy bilaterally.  Low
attenuation change in the deep white matter consistent with small
vessel ischemia.  No mass effect or midline shift.  No ventricular
dilatation.  No abnormal extra-axial fluid collections.  Gray-white
matter junctions are distinct.  Basal cisterns are not effaced.  No
evidence of acute intracranial hemorrhage.  Vascular calcifications
in the intracranial arteries.  Visualized paranasal sinuses are not
opacified.  No depressed skull fractures.  No significant change
since the previous study.
IMPRESSION: Diffuse atrophic and small vessel ischemic changes.  No evidence of
acute intracranial hemorrhage, mass lesion, or acute infarct.

## 2013-02-16 ENCOUNTER — Encounter (HOSPITAL_BASED_OUTPATIENT_CLINIC_OR_DEPARTMENT_OTHER)
Admission: RE | Admit: 2013-02-16 | Discharge: 2013-02-16 | Disposition: A | Discharge status: Home / Self Care | Payer: Medicare HMO | Source: Ambulatory Visit | Attending: Orthopedic Surgery | Admitting: Orthopedic Surgery

## 2013-02-16 LAB — BASIC METABOLIC PANEL
Chloride: 100 mEq/L (ref 96–112)
Creatinine, Ser: 0.85 mg/dL (ref 0.50–1.10)
GFR calc Af Amer: 71 mL/min — ABNORMAL LOW (ref >90)
Potassium: 3.7 mEq/L (ref 3.5–5.1)
Sodium: 137 mEq/L (ref 135–145)

## 2013-02-16 NOTE — H&P (Signed)
[name] is an 79 y.o. female.   Chief Complaint: c/o chronic STS of the left long and ring fingers HPI: Omar Orrego returns with triggering of her left long and  ring fingers. She has been sore with triggering symptoms for 2 months.   Past Medical History  Diagnosis Date  . Hemorrhoids   . Hypertension   . Anemia   . Diverticulitis   . Hyperlipidemia   . Depression   . Anxiety   . CAD (coronary artery disease)      a. NSTEMI 7/12 with cath: dLM 20%, mLAD 60%, dLAD 30%, Dx 40%, mCFX at AV groove 30%, prox to mid RCA 30-40%, PDA 50%, EF 35% with apical ballooning and DK of the apex;      . Takotsubo cardiomyopathy     a. echo 7/12:  EF 35%, inf-sep, AS and inf HK, mild LVH, mild MR;  b. echo 10/12: EF 60%, mild LAE  . Syncope     2/2 mobitz 2  . Mobitz type 2 second degree atrioventricular block   . LBBB (left bundle branch block)   . Biventricular cardiac pacemaker in situ     St. Jude    Past Surgical History  Procedure Laterality Date  . Carpal tunnel release    . Inguinal hernia repair    . Splenectomy    . Cholecystectomy    . Appendectomy    . Tubal ligation    . Pacemaker insertion      SJM BiV pacemaker implanted 7/12 by Dr Johney Frame    Family History  Problem Relation Age of Onset  . Coronary artery disease Neg Hx    Social History:  reports that she has never smoked. She has never used smokeless tobacco. She reports that she does not drink alcohol or use illicit drugs.  Allergies:  Allergies  Allergen Reactions  . Codeine     No prescriptions prior to admission    Results for orders placed during the hospital encounter of 02/17/13 (from the past 48 hour(s))  BASIC METABOLIC PANEL     Status: Abnormal   Collection Time    02/16/13  3:30 PM      Result Value Range   Sodium 137  135 - 145 mEq/L   Potassium 3.7  3.5 - 5.1 mEq/L   Chloride 100  96 - 112 mEq/L   CO2 28  19 - 32 mEq/L   Glucose, Bld 126 (*) 70 - 99 mg/dL   BUN 17  6 -  23 mg/dL   Creatinine, Ser 1.61  0.50 - 1.10 mg/dL   Calcium 9.5  8.4 - 09.6 mg/dL   GFR calc non Af Amer 62 (*) >90 mL/min   GFR calc Af Amer 71 (*) >90 mL/min   Comment:            The eGFR has been calculated     using the CKD EPI equation.     This calculation has not been     validated in all clinical     situations.     eGFR's persistently     <90 mL/min signify     possible Chronic Kidney Disease.    No results found.   Pertinent items are noted in HPI.  Height 5\' 4"  (1.626 m), weight 64.411 kg (142 lb).  General appearance: alert Head: Normocephalic, without obvious abnormality Neck: supple, symmetrical, trachea midline Resp: clear to auscultation bilaterally Cardio: regular rate and rhythm GI: normal findings: bowel  sounds normal Extremities: Physical exam reveals a large cyst over her left ring finger A-1 pulley with triggering in flexion. She does not have a PIP flexion contracture. Her pulses and capillary refill are intact. She also has STS of the left long finger Pulses: 2+ and symmetric Skin: normal Neurologic: Grossly normal    Assessment/Plan Impression: Chronic STS left long and ring fingers.   Plan: Top the OR for release A-1 pulleys left long and ring fingers with cyst excision left ring finger A-1 pulley.The procedure, risks,benefits and post-op course were discussed with the patient at length and they were in agreement with the plan.  DASNOIT,Kimmie Berggren J 02/16/2013, 9:47 PM  H&P documentation: 02/17/2013  -History and Physical Reviewed  -Patient has been re-examined  -No change in the plan of care  Wyn Forster, MD

## 2013-02-16 NOTE — Progress Notes (Signed)
Reviewed chart with Dr Gelene Mink. [facility].

## 2013-02-17 ENCOUNTER — Encounter (HOSPITAL_BASED_OUTPATIENT_CLINIC_OR_DEPARTMENT_OTHER)
Admission: RE | Disposition: A | Discharge status: Home / Self Care | Payer: Self-pay | Source: Ambulatory Visit | Attending: Orthopedic Surgery

## 2013-02-17 ENCOUNTER — Encounter (HOSPITAL_BASED_OUTPATIENT_CLINIC_OR_DEPARTMENT_OTHER): Payer: Self-pay | Admitting: Anesthesiology

## 2013-02-17 ENCOUNTER — Ambulatory Visit (HOSPITAL_BASED_OUTPATIENT_CLINIC_OR_DEPARTMENT_OTHER): Payer: Medicare HMO | Admitting: Anesthesiology

## 2013-02-17 ENCOUNTER — Ambulatory Visit (HOSPITAL_BASED_OUTPATIENT_CLINIC_OR_DEPARTMENT_OTHER)
Admission: RE | Admit: 2013-02-17 | Discharge: 2013-02-17 | Disposition: A | Discharge status: Home / Self Care | Payer: Medicare HMO | Source: Ambulatory Visit | Attending: Orthopedic Surgery | Admitting: Orthopedic Surgery

## 2013-02-17 ENCOUNTER — Encounter (HOSPITAL_BASED_OUTPATIENT_CLINIC_OR_DEPARTMENT_OTHER): Payer: Self-pay

## 2013-02-17 DIAGNOSIS — Z950 Presence of cardiac pacemaker: Secondary | ICD-10-CM | POA: Insufficient documentation

## 2013-02-17 DIAGNOSIS — Z01812 Encounter for preprocedural laboratory examination: Secondary | ICD-10-CM | POA: Insufficient documentation

## 2013-02-17 DIAGNOSIS — M6740 Ganglion, unspecified site: Secondary | ICD-10-CM | POA: Insufficient documentation

## 2013-02-17 DIAGNOSIS — M65839 Other synovitis and tenosynovitis, unspecified forearm: Secondary | ICD-10-CM | POA: Insufficient documentation

## 2013-02-17 DIAGNOSIS — M6530 Trigger finger, unspecified finger: Secondary | ICD-10-CM | POA: Insufficient documentation

## 2013-02-17 DIAGNOSIS — E119 Type 2 diabetes mellitus without complications: Secondary | ICD-10-CM | POA: Insufficient documentation

## 2013-02-17 DIAGNOSIS — I10 Essential (primary) hypertension: Secondary | ICD-10-CM | POA: Insufficient documentation

## 2013-02-17 DIAGNOSIS — M65849 Other synovitis and tenosynovitis, unspecified hand: Secondary | ICD-10-CM | POA: Insufficient documentation

## 2013-02-17 DIAGNOSIS — M19049 Primary osteoarthritis, unspecified hand: Secondary | ICD-10-CM | POA: Insufficient documentation

## 2013-02-17 HISTORY — PX: TRIGGER FINGER RELEASE: SHX641

## 2013-02-17 SURGERY — RELEASE, A1 PULLEY, FOR TRIGGER FINGER
Anesthesia: Monitor Anesthesia Care | Site: Hand | Laterality: Left | Wound class: Clean

## 2013-02-17 MED ORDER — CHLORHEXIDINE GLUCONATE 4 % EX LIQD: 60.0000 mL | Freq: Once | CUTANEOUS | Status: DC

## 2013-02-17 MED ORDER — PROPOFOL INFUSION 10 MG/ML OPTIME
INTRAVENOUS | Status: DC | PRN
  Administered 2013-02-17: 50 ug/kg/min via INTRAVENOUS

## 2013-02-17 MED ORDER — LACTATED RINGERS IV SOLN
INTRAVENOUS | Status: DC
  Administered 2013-02-17: 10:00:00 via INTRAVENOUS

## 2013-02-17 MED ORDER — LIDOCAINE HCL 2 % IJ SOLN
INTRAMUSCULAR | Status: DC | PRN
  Administered 2013-02-17: 4 mL

## 2013-02-17 MED ORDER — LIDOCAINE HCL (CARDIAC) 20 MG/ML IV SOLN
INTRAVENOUS | Status: DC | PRN
  Administered 2013-02-17: 60 mg via INTRAVENOUS

## 2013-02-17 MED ORDER — 0.9 % SODIUM CHLORIDE (POUR BTL) OPTIME
TOPICAL | Status: DC | PRN
  Administered 2013-02-17: 1000 mL

## 2013-02-17 MED ORDER — FENTANYL CITRATE 0.05 MG/ML IJ SOLN
INTRAMUSCULAR | Status: DC | PRN
  Administered 2013-02-17: 50 ug via INTRAVENOUS
  Administered 2013-02-17: 25 ug via INTRAVENOUS

## 2013-02-17 MED ORDER — ONDANSETRON HCL 4 MG/2ML IJ SOLN
INTRAMUSCULAR | Status: DC | PRN
  Administered 2013-02-17: 4 mg via INTRAVENOUS

## 2013-02-17 MED ORDER — OXYCODONE-ACETAMINOPHEN 5-325 MG PO TABS: ORAL_TABLET | ORAL | Status: DC

## 2013-02-17 SURGICAL SUPPLY — 41 items
BANDAGE ADHESIVE 1X3 (GAUZE/BANDAGES/DRESSINGS) IMPLANT
BANDAGE COBAN STERILE 2 (GAUZE/BANDAGES/DRESSINGS) ×1 IMPLANT
BANDAGE ELASTIC 3 VELCRO ST LF (GAUZE/BANDAGES/DRESSINGS) ×1 IMPLANT
BLADE SURG 15 STRL LF DISP TIS (BLADE) ×1 IMPLANT
BLADE SURG 15 STRL SS (BLADE) ×2
BNDG CMPR 9X4 STRL LF SNTH (GAUZE/BANDAGES/DRESSINGS) ×1
BNDG ESMARK 4X9 LF (GAUZE/BANDAGES/DRESSINGS) ×1 IMPLANT
BRUSH SCRUB EZ PLAIN DRY (MISCELLANEOUS) ×2 IMPLANT
CLOTH BEACON ORANGE TIMEOUT ST (SAFETY) ×2 IMPLANT
CORDS BIPOLAR (ELECTRODE) IMPLANT
COVER MAYO STAND STRL (DRAPES) ×2 IMPLANT
COVER TABLE BACK 60X90 (DRAPES) ×2 IMPLANT
CUFF TOURNIQUET SINGLE 18IN (TOURNIQUET CUFF) ×1 IMPLANT
DECANTER SPIKE VIAL GLASS SM (MISCELLANEOUS) ×1 IMPLANT
DRAPE EXTREMITY T 121X128X90 (DRAPE) ×2 IMPLANT
DRAPE SURG 17X23 STRL (DRAPES) ×2 IMPLANT
GAUZE SPONGE 4X4 12PLY STRL LF (GAUZE/BANDAGES/DRESSINGS) ×3 IMPLANT
GLOVE BIO SURGEON STRL SZ 6.5 (GLOVE) ×2 IMPLANT
GLOVE BIOGEL M STRL SZ7.5 (GLOVE) ×2 IMPLANT
GLOVE BIOGEL PI IND STRL 7.5 (GLOVE) IMPLANT
GLOVE BIOGEL PI INDICATOR 7.5 (GLOVE) ×1
GLOVE ECLIPSE 7.5 STRL STRAW (GLOVE) ×1 IMPLANT
GLOVE EXAM NITRILE LRG STRL (GLOVE) ×3 IMPLANT
GLOVE ORTHO TXT STRL SZ7.5 (GLOVE) ×2 IMPLANT
GOWN BRE IMP PREV XXLGXLNG (GOWN DISPOSABLE) ×3 IMPLANT
GOWN PREVENTION PLUS XLARGE (GOWN DISPOSABLE) ×4 IMPLANT
NEEDLE 27GAX1X1/2 (NEEDLE) IMPLANT
PACK BASIN DAY SURGERY FS (CUSTOM PROCEDURE TRAY) ×2 IMPLANT
PADDING CAST ABS 4INX4YD NS (CAST SUPPLIES) ×1
PADDING CAST ABS COTTON 4X4 ST (CAST SUPPLIES) ×1 IMPLANT
SPONGE GAUZE 4X4 12PLY (GAUZE/BANDAGES/DRESSINGS) ×1 IMPLANT
STOCKINETTE 4X48 STRL (DRAPES) ×2 IMPLANT
STRIP CLOSURE SKIN 1/2X4 (GAUZE/BANDAGES/DRESSINGS) ×1 IMPLANT
SUT ETHILON 5 0 P 3 18 (SUTURE)
SUT NYLON ETHILON 5-0 P-3 1X18 (SUTURE) IMPLANT
SUT PROLENE 3 0 PS 2 (SUTURE) IMPLANT
SUT PROLENE 4 0 P 3 18 (SUTURE) IMPLANT
SYR 3ML 23GX1 SAFETY (SYRINGE) IMPLANT
SYR CONTROL 10ML LL (SYRINGE) ×1 IMPLANT
TRAY DSU PREP LF (CUSTOM PROCEDURE TRAY) ×2 IMPLANT
UNDERPAD 30X30 INCONTINENT (UNDERPADS AND DIAPERS) ×2 IMPLANT

## 2013-02-17 NOTE — Brief Op Note (Signed)
02/17/2013  12:50 PM  PATIENT:  [name]  79 y.o. female  PRE-OPERATIVE DIAGNOSIS:  CYST/STS LEFT RING; STS LEFT LONG  POST-OPERATIVE DIAGNOSIS:  CYST/STS LEFT RING ;STS LEFT LONG  PROCEDURE:  Procedure(s): RELEASE TRIGGER FINGER/A-1 PULLEY LEFT LONG AND LEFT RING (Left) EXCISION CYST (Left)  SURGEON:  Surgeon(s) and Role:    * Wyn Forster., MD - Primary  PHYSICIAN ASSISTANT:   ASSISTANTS:surgical tech  ANESTHESIA:   IV sedation  EBL:  Total I/O In: 300 [I.V.:300] Out: -   BLOOD ADMINISTERED:none  DRAINS: none   LOCAL MEDICATIONS USED:  XYLOCAINE   SPECIMEN:  No Specimen  DISPOSITION OF SPECIMEN:  N/A  COUNTS:  YES  TOURNIQUET:   Total Tourniquet Time Documented: Upper Arm (Left) - 20 minutes Total: Upper Arm (Left) - 20 minutes   DICTATION: .Other Dictation: Dictation Number [id number]  PLAN OF CARE: Admit for overnight observation  PATIENT DISPOSITION:  PACU - hemodynamically stable.   Delay start of Pharmacological VTE agent (>24hrs) due to surgical blood loss or risk of bleeding: not applicable

## 2013-02-17 NOTE — Anesthesia Postprocedure Evaluation (Signed)
Anesthesia Post Note  Patient: [name]  Procedure(s) Performed: Procedure(s) (LRB): RELEASE TRIGGER FINGER/A-1 PULLEY LEFT LONG AND LEFT RING (Left) EXCISION CYST (Left)  Anesthesia type: MAC  Patient location: PACU  Post pain: Pain level controlled  Post assessment: Patient's Cardiovascular Status Stable  Last Vitals:  Filed Vitals:   02/17/13 1315  BP: 143/65  Pulse:   Temp:   Resp:     Post vital signs: Reviewed and stable  Level of consciousness: alert  Complications: No apparent anesthesia complications

## 2013-02-17 NOTE — Transfer of Care (Signed)
Immediate Anesthesia Transfer of Care Note  Patient: [name]  Procedure(s) Performed: Procedure(s): RELEASE TRIGGER FINGER/A-1 PULLEY LEFT LONG AND LEFT RING (Left) EXCISION CYST (Left)  Patient Location: PACU  Anesthesia Type:MAC  Level of Consciousness: awake, alert  and oriented  Airway & Oxygen Therapy: Patient Spontanous Breathing  Post-op Assessment: Report given to PACU RN and Post -op Vital signs reviewed and stable  Post vital signs: Reviewed and stable  Complications: No apparent anesthesia complications

## 2013-02-17 NOTE — Anesthesia Procedure Notes (Signed)
Procedure Name: MAC Performed by: Brynden Thune W Pre-anesthesia Checklist: Patient identified, Timeout performed, Emergency Drugs available, Suction available and Patient being monitored Patient Re-evaluated:Patient Re-evaluated prior to inductionOxygen Delivery Method: Simple face mask Placement Confirmation: positive ETCO2 Dental Injury: Teeth and Oropharynx as per pre-operative assessment      

## 2013-02-17 NOTE — Op Note (Signed)
[id number] 

## 2013-02-17 NOTE — Anesthesia Preprocedure Evaluation (Addendum)
Anesthesia Evaluation  Patient identified by MRN, date of birth, ID band Patient awake    Reviewed: Allergy & Precautions, H&P , NPO status , Patient's Chart, lab work & pertinent test results, reviewed documented beta blocker date and time   Airway Mallampati: II TM Distance: >3 FB Neck ROM: full    Dental   Pulmonary neg pulmonary ROS,  breath sounds clear to auscultation        Cardiovascular hypertension, + CAD + dysrhythmias + pacemaker Rhythm:regular     Neuro/Psych PSYCHIATRIC DISORDERS negative neurological ROS  negative psych ROS   GI/Hepatic negative GI ROS, Neg liver ROS,   Endo/Other  negative endocrine ROS  Renal/GU negative Renal ROS  negative genitourinary   Musculoskeletal   Abdominal   Peds  Hematology  (+) anemia ,   Anesthesia Other Findings See surgeon's H&P   Reproductive/Obstetrics negative OB ROS                          Anesthesia Physical Anesthesia Plan  ASA: III  Anesthesia Plan: MAC   Post-op Pain Management:    Induction: Intravenous  Airway Management Planned: Simple Face Mask  Additional Equipment:   Intra-op Plan:   Post-operative Plan:   Informed Consent: I have reviewed the patients History and Physical, chart, labs and discussed the procedure including the risks, benefits and alternatives for the proposed anesthesia with the patient or authorized representative who has indicated his/her understanding and acceptance.   Dental Advisory Given  Plan Discussed with: CRNA and Surgeon  Anesthesia Plan Comments:         Anesthesia Quick Evaluation

## 2013-02-18 ENCOUNTER — Encounter (HOSPITAL_BASED_OUTPATIENT_CLINIC_OR_DEPARTMENT_OTHER): Payer: Self-pay | Admitting: Orthopedic Surgery

## 2013-02-18 LAB — POCT HEMOGLOBIN-HEMACUE: Hemoglobin: 12.1 g/dL (ref 12.0–15.0)

## 2013-02-18 NOTE — Progress Notes (Signed)
Pt reports pain, bruising and limited movement of Rt ring finger. Reports adequate cap refill, warmth and sensation of fingers. Encouraged to call MD to inform of difficulty.

## 2013-02-18 NOTE — Op Note (Signed)
[name], [name]          ACCOUNT NO.:  [id number]  MEDICAL RECORD NO.:  [id number]  LOCATION:                                 FACILITY:  PHYSICIAN:  Katy Fitch. Clifton Safley, M.D. 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  POSTOPERATIVE DIAGNOSES:  Severe stenosing tenosynovitis of left long and ring fingers, with multilobular myxoid cyst of left ring finger at A1 pulley and profound adhesive synovitis of left ring finger flexor tendons.  OPERATION: 1. Resection of myxoid cyst from left ring finger A1 pulley. 2. Release of left ring finger A1 pulley and tenosynovectomy of     superficialis and profundus tendons. 3. Release of left long finger A1 pulley.  SURGEON:  Katy Fitch. Eveleen Mcnear, MD.  ASSISTANT:  Surgical technician.  ANESTHESIA:  2% lidocaine flexor sheath block and palm block supplemented by IV sedation.  SUPERVISING ANESTHESIOLOGIST:  Dr. Gelene Mink.  INDICATIONS:  [name] is an 79 year old woman referred through the courtesy of Dr. Nicholos Johns for evaluation of hand pain and finger stiffness.  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  She also has a mass in her left palm overlying the flexor sheath of the left ring finger.  She had active triggering and locking of the long and ring fingers in flexion.  We had detailed informed consent during which we advised her to proceed with resection of her cyst and release of the A1 pulley of the long and ring finger.  We advised her that we would proceed with tenosynovectomy if our findings of surgery dictated.  This was appropriate.  After informed consent, she is  brought to the operating room at this time.  She is noted to have type 2 diabetes.  Preoperatively questions were invited and answered in detail at the office and in the holding area.  PROCEDURE:  [name] was interviewed in the holding area at the [facility] and after informed consent, we marked her left long and ring fingers as the proper surgical site for her procedure per protocol.  Dr. Gelene Mink provided detailed anesthesia informed consent and a decision was made to proceed with IV sedation and monitored anesthesia care.  She was then transferred to room 1 of the [facility] and placed in supine position on the operating table.  Pneumatic tourniquet was applied to the proximal brachium.  Following routine Betadine scrub and paint of the left upper extremity, sterile stockinette and impervious arthroscopy drapes were applied followed by a routine surgical time-out.  2% lidocaine was infiltrated in the path intended incisions overlying the flexor sheath of the left long and ring fingers, and into the flexor sheath of the long and ring fingers.  After few moments, excellent anesthesia was achieved.  The left hand and arm were then exsanguinated with an Esmarch bandage and the arterial tourniquet proximal brachium inflated to 250 mmHg due to mild systolic hypertension.  Procedure commenced with an oblique incision directly over the cyst at the A1 pulley of left ring finger.  Subcutaneous tissues were carefully divided taking care to identify and gently retract the radial and ulnar neurovascular bundles, as well as the common neurovascular bundles.  A multilobular cyst was noted on the radial aspect of the A1 pulley of the ring finger.  This was removed piecemeal with a microrongeur.  The pulley was noted to be markedly thickened with a wall thickness of 2 mm or more.  The pulley was then released along its radial border.  Thereafter passive  range of motion finger revealed residual triggering in the junction between the A1 and A2 pulleys, therefore some of the junctional tissues were removed distally.  Thereafter the flexor tendons were delivered with forceps and use of a Ragnell retractor.  There was noted to be a bulbous swelling of the superficialis tendon.  This should resolve over time.  There was considerable adhesive tenosynovitis between the superficialis profundus tendons.  The preputial fold was resected and a formal tenosynovectomy of the superficialis and profundus tendons was accomplished in the palm to the level of the A2 pulley distally.  Thereafter full active range of motion was demonstrated by Ms. Platt who was sedated but aroused to range her finger.  This wound was then dressed and attention directed to the long finger. An oblique incision was fashioned of the A1 pulley.  Subcutaneous tissues were carefully retracted and the neurovascular structures were protected with Ragnell retractors.  The A1 pulley was isolated, found to be quite thickened approximately 2 mm wall thickness.  This was then released along its radial border with scissors to the junctional tissues towards the A2 pulley.  The tendons delivered and found not to have significant tenosynovitis. Thereafter, the Ms. Selke demonstrated full active flexion of the long finger and ring finger.  The wounds were repaired with intradermal 3-0 Prolene and Steri-Strips. A compressive dressing was applied with sterile gauze, followed by a Coban wrist and hand dressing.  There were no apparent complications.  For aftercare, Ms. Schlender provided a prescription for Percocet 5 mg 1 or 2 tablets p.o. q.4-6 hours p.r.n. pain, 20 tablets without refill. She also notes that it is her habit to use Extra-Strength Tylenol and is likely she will use this as a primary analgesic.     Katy Fitch Corban Kistler, M.D.     RVS/MEDQ  D:  02/17/2013  T:   02/18/2013  Job:  [id number]

## 2013-03-16 ENCOUNTER — Ambulatory Visit (INDEPENDENT_AMBULATORY_CARE_PROVIDER_SITE_OTHER): Payer: Medicare HMO | Admitting: Internal Medicine

## 2013-03-16 ENCOUNTER — Encounter: Payer: Self-pay | Admitting: Internal Medicine

## 2013-03-16 VITALS — BP 110/60 | HR 70 | Ht 64.0 in | Wt 146.6 lb

## 2013-03-16 DIAGNOSIS — I10 Essential (primary) hypertension: Secondary | ICD-10-CM

## 2013-03-16 DIAGNOSIS — Z950 Presence of cardiac pacemaker: Secondary | ICD-10-CM

## 2013-03-16 DIAGNOSIS — I441 Atrioventricular block, second degree: Secondary | ICD-10-CM

## 2013-03-16 DIAGNOSIS — I5181 Takotsubo syndrome: Secondary | ICD-10-CM

## 2013-03-16 LAB — PACEMAKER DEVICE OBSERVATION
AL THRESHOLD: 0.75 V
BAMS-0001: 150 {beats}/min
BATTERY VOLTAGE: 2.9478 V
DEVICE MODEL PM: 2648000
LV LEAD THRESHOLD: 1 V
RV LEAD AMPLITUDE: 12 mv

## 2013-03-16 NOTE — Progress Notes (Signed)
PCP: [name], MD Primary Cardiologist:  Dr Antoine Poche   The patient presents today for routine electrophysiology followup.  Since last being seen in our clinic, the patient reports doing very well. Today, she denies symptoms of palpitations, chest pain, shortness of breath, orthopnea, PND, lower extremity edema, dizziness, presyncope, syncope, or neurologic sequela.  The patient feels that she is tolerating medications without difficulties and is otherwise without complaint today.   Past Medical History  Diagnosis Date  . Hemorrhoids   . Hypertension   . Anemia   . Diverticulitis   . Hyperlipidemia   . Depression   . Anxiety   . CAD (coronary artery disease)      a. NSTEMI 7/12 with cath: dLM 20%, mLAD 60%, dLAD 30%, Dx 40%, mCFX at AV groove 30%, prox to mid RCA 30-40%, PDA 50%, EF 35% with apical ballooning and DK of the apex;      . Takotsubo cardiomyopathy     a. echo 7/12:  EF 35%, inf-sep, AS and inf HK, mild LVH, mild MR;  b. echo 10/12: EF 60%, mild LAE  . Syncope     2/2 mobitz 2  . Mobitz type 2 second degree atrioventricular block   . LBBB (left bundle branch block)   . Biventricular cardiac pacemaker in situ     St. Jude   Past Surgical History  Procedure Laterality Date  . Carpal tunnel release    . Inguinal hernia repair    . Splenectomy    . Cholecystectomy    . Appendectomy    . Tubal ligation    . Pacemaker insertion      SJM BiV pacemaker implanted 7/12 by Dr Johney Frame  . Trigger finger release Left 02/17/2013    Procedure: RELEASE TRIGGER FINGER/A-1 PULLEY LEFT LONG AND LEFT RING;  Surgeon: Wyn Forster., MD;  Location: [facility];  Service: Orthopedics;  Laterality: Left;    Current Outpatient Prescriptions  Medication Sig Dispense Refill  . acetaminophen (TYLENOL) 325 MG tablet Take 650 mg by mouth as needed.        Marland Kitchen aspirin 81 MG tablet Take 81 mg by mouth daily.        Marland Kitchen atorvastatin (LIPITOR) 10 MG tablet Take 5 mg by mouth  daily.       . Calcium Carbonate-Vitamin D (CALCIUM 600 + D PO) Take by mouth 2 (two) times daily.       . carvedilol (COREG) 3.125 MG tablet Take 1 tablet (3.125 mg total) by mouth 2 (two) times daily with a meal.  60 tablet  6  . diazepam (VALIUM) 5 MG tablet Take 5 mg by mouth daily.       . ferrous sulfate ([employer] IRON) 325 (65 FE) MG tablet Take 325 mg by mouth daily with breakfast.       . fish oil-omega-3 fatty acids 1000 MG capsule Take 2 g by mouth 2 (two) times daily.       Marland Kitchen gabapentin (NEURONTIN) 100 MG capsule Take 100 mg by mouth 3 (three) times daily.       Marland Kitchen lisinopril-hydrochlorothiazide (PRINZIDE,ZESTORETIC) 20-12.5 MG per tablet Take 1 tablet by mouth daily.        . multivitamin-iron-minerals-folic acid (CENTRUM) chewable tablet Chew 1 tablet by mouth daily.        Marland Kitchen oxyCODONE-acetaminophen (PERCOCET/ROXICET) 5-325 MG per tablet Take 1 or 2 tablets every 4-6 hours as needed for postsurgical pain.  20 tablet  0  . Sennosides (  CVS SENNA-C PO) Take by mouth as directed.        . traZODone (DESYREL) 50 MG tablet Take 50 mg by mouth at bedtime.        Marland Kitchen venlafaxine (EFFEXOR-XR) 75 MG 24 hr capsule Take 150 mg by mouth daily.        No current facility-administered medications for this visit.    Allergies  Allergen Reactions  . Codeine     History   Social History  . Marital Status: Widowed    Spouse Name: N/A    Number of Children: N/A  . Years of Education: N/A   Occupational History  . Not on file.   Social History Main Topics  . Smoking status: Never Smoker   . Smokeless tobacco: Never Used  . Alcohol Use: No  . Drug Use: No  . Sexually Active: Not on file   Other Topics Concern  . Not on file   Social History Narrative   The patient lives alone. Still works at [employer] in the [employer]. She is a widow. She does have chikdren. She does not smoke cigarettes and she does not drink alcohol     Physical Exam: Filed Vitals:   03/16/13 1128  BP: 110/60   Pulse: 70  Height: 5\' 4"  (1.626 m)  Weight: 146 lb 9.6 oz (66.497 kg)    GEN- The patient is well appearing, alert and oriented x 3 today.   Head- normocephalic, atraumatic Eyes-  Sclera clear, conjunctiva pink Ears- hearing intact Oropharynx- clear Neck- supple, no JVP Lymph- no cervical lymphadenopathy Lungs- Clear to ausculation bilaterally, normal work of breathing Chest- pacemaker pocket is well healed Heart- Regular rate and rhythm, no murmurs, rubs or gallops, PMI not laterally displaced GI- soft, NT, ND, + BS Extremities- no clubbing, cyanosis, or edema MS- no significant deformity or atrophy  Pacemaker interrogation- reviewed in detail today,  See PACEART report  Assessment and Plan:  1. Nonischemic CM Doing well s/p CRT-P Normal pacemaker function See Pace Art report No changes today  2. Mobitz II second degree AV block Normal PPM function  3. HTN Stable No change required today   Merlin Return in 1 year to see the device clinic PA

## 2013-03-16 NOTE — Patient Instructions (Addendum)
Your physician wants you to follow-up in: YEAR WITH   BROOKE   You will receive a reminder letter in the mail two months in advance. If you don't receive a letter, please call our office to schedule the follow-up appointment. Your physician recommends that you continue on your current medications as directed. Please refer to the Current Medication list given to you today.

## 2013-03-18 ENCOUNTER — Telehealth: Payer: Self-pay | Admitting: *Deleted

## 2013-03-18 NOTE — Telephone Encounter (Signed)
Pt was calling to get her med list updated

## 2013-05-13 ENCOUNTER — Other Ambulatory Visit: Payer: Self-pay

## 2013-05-13 DIAGNOSIS — Z1231 Encounter for screening mammogram for malignant neoplasm of breast: Secondary | ICD-10-CM

## 2013-06-10 ENCOUNTER — Ambulatory Visit: Payer: Medicare HMO

## 2013-06-16 ENCOUNTER — Ambulatory Visit
Admission: RE | Admit: 2013-06-16 | Discharge: 2013-06-16 | Disposition: A | Discharge status: Home / Self Care | Payer: Medicare HMO | Source: Ambulatory Visit

## 2013-06-16 DIAGNOSIS — Z1231 Encounter for screening mammogram for malignant neoplasm of breast: Secondary | ICD-10-CM

## 2013-06-21 ENCOUNTER — Other Ambulatory Visit: Payer: Self-pay | Admitting: Internal Medicine

## 2013-06-21 DIAGNOSIS — R928 Other abnormal and inconclusive findings on diagnostic imaging of breast: Secondary | ICD-10-CM

## 2013-06-23 ENCOUNTER — Other Ambulatory Visit: Payer: Self-pay | Admitting: Internal Medicine

## 2013-07-04 ENCOUNTER — Ambulatory Visit
Admission: RE | Admit: 2013-07-04 | Discharge: 2013-07-04 | Disposition: A | Discharge status: Home / Self Care | Payer: Medicare HMO | Source: Ambulatory Visit | Attending: Internal Medicine | Admitting: Internal Medicine

## 2013-07-04 DIAGNOSIS — R928 Other abnormal and inconclusive findings on diagnostic imaging of breast: Secondary | ICD-10-CM

## 2013-07-12 ENCOUNTER — Encounter: Payer: Self-pay | Admitting: Cardiology

## 2013-07-12 ENCOUNTER — Ambulatory Visit (INDEPENDENT_AMBULATORY_CARE_PROVIDER_SITE_OTHER): Payer: Medicare HMO | Admitting: Cardiology

## 2013-07-12 VITALS — BP 115/75 | HR 78 | Ht 64.0 in | Wt 147.4 lb

## 2013-07-12 DIAGNOSIS — I2510 Atherosclerotic heart disease of native coronary artery without angina pectoris: Secondary | ICD-10-CM

## 2013-07-12 DIAGNOSIS — I10 Essential (primary) hypertension: Secondary | ICD-10-CM

## 2013-07-12 DIAGNOSIS — Z950 Presence of cardiac pacemaker: Secondary | ICD-10-CM

## 2013-07-12 NOTE — Progress Notes (Signed)
HPI The patient presents for follow Takatsubo's cardiomyopathy and  second-degree heart block she done quite well.  Follow up echocardiogram demonstrated EF to be 60%. She is not having any palpitations and has had no further presyncope or syncope. She has had no chest pressure, neck or arm discomfort. She has had no shortness of breath, PND or orthopnea. She has had no weight gain or edema. She is active and does work around the house.  She is still having a hard time with the death of her son last year.  Of note she did have rash which was unexplained but is getting better.   Allergies  Allergen Reactions  . Codeine     Current Outpatient Prescriptions  Medication Sig Dispense Refill  . acetaminophen (TYLENOL) 325 MG tablet Take 650 mg by mouth as needed.        Marland Kitchen aspirin 81 MG tablet Take 81 mg by mouth daily.        Marland Kitchen atorvastatin (LIPITOR) 10 MG tablet Take 5 mg by mouth daily.       . Calcium Carbonate-Vitamin D (CALCIUM 600 + D PO) Take by mouth 2 (two) times daily.       . carvedilol (COREG) 3.125 MG tablet TAKE 1 TABLET BY MOUTH 2 TIMES DAILY WITH A MEAL.  60 tablet  6  . diazepam (VALIUM) 5 MG tablet Take 5 mg by mouth daily.       . ferrous sulfate (CVS IRON) 325 (65 FE) MG tablet Take 325 mg by mouth daily with breakfast.       . fish oil-omega-3 fatty acids 1000 MG capsule Take 2 g by mouth 2 (two) times daily.       Marland Kitchen gabapentin (NEURONTIN) 100 MG capsule Take 100 mg by mouth 3 (three) times daily.       Marland Kitchen lisinopril-hydrochlorothiazide (PRINZIDE,ZESTORETIC) 20-12.5 MG per tablet Take 1 tablet by mouth daily.        . multivitamin-iron-minerals-folic acid (CENTRUM) chewable tablet Chew 1 tablet by mouth daily.        Marland Kitchen oxyCODONE-acetaminophen (PERCOCET/ROXICET) 5-325 MG per tablet Take 1 or 2 tablets every 4-6 hours as needed for postsurgical pain.  20 tablet  0  . Sennosides (CVS SENNA-C PO) Take by mouth as directed.        . traZODone (DESYREL) 50 MG tablet Take 50 mg by  mouth at bedtime.        Marland Kitchen venlafaxine XR (EFFEXOR-XR) 150 MG 24 hr capsule Take 150 mg by mouth daily.       No current facility-administered medications for this visit.    Past Medical History  Diagnosis Date  . Hemorrhoids   . Hypertension   . Anemia   . Diverticulitis   . Hyperlipidemia   . Depression   . Anxiety   . CAD (coronary artery disease)      a. NSTEMI 7/12 with cath: dLM 20%, mLAD 60%, dLAD 30%, Dx 40%, mCFX at AV groove 30%, prox to mid RCA 30-40%, PDA 50%, EF 35% with apical ballooning and DK of the apex;      . Takotsubo cardiomyopathy     a. echo 7/12:  EF 35%, inf-sep, AS and inf HK, mild LVH, mild MR;  b. echo 10/12: EF 60%, mild LAE  . Syncope     2/2 mobitz 2  . Mobitz type 2 second degree atrioventricular block   . LBBB (left bundle branch block)   . Biventricular cardiac pacemaker in  situ     St. Jude    Past Surgical History  Procedure Laterality Date  . Carpal tunnel release    . Inguinal hernia repair    . Splenectomy    . Cholecystectomy    . Appendectomy    . Tubal ligation    . Pacemaker insertion      SJM BiV pacemaker implanted 7/12 by Dr Johney Frame  . Trigger finger release Left 02/17/2013    Procedure: RELEASE TRIGGER FINGER/A-1 PULLEY LEFT LONG AND LEFT RING;  Surgeon: Wyn Forster., MD;  Location: [facility];  Service: Orthopedics;  Laterality: Left;    ROS:  Back pain. Otherwise as stated in the HPI and negative for all other systems.  PHYSICAL EXAM BP 115/75  Pulse 78  Ht 5\' 4"  (1.626 m)  Wt 147 lb 6.4 oz (66.86 kg)  BMI 25.29 kg/m2 GENERAL:  Well appearing HEENT:  Pupils equal round and reactive, fundi not visualized, oral mucosa unremarkable NECK:  No jugular venous distention, waveform within normal limits, carotid upstroke brisk and symmetric, no bruits, no thyromegaly LUNGS:  Clear to auscultation bilaterally CHEST:  Pacemaker pocket well healed. HEART:  PMI not displaced or sustained,S1 and S2 within  normal limits, no S3, no S4, no clicks, no rubs, no murmurs ABD:  Flat, positive bowel sounds normal in frequency in pitch, no bruits, no rebound, no guarding, no midline pulsatile mass, no hepatomegaly, no splenomegaly EXT:  2 plus pulses throughout, no edema, no cyanosis no clubbing   EKG:  NSR rate 70 with ventricular pacing 07/12/2013   ASSESSMENT AND PLAN   Takotsubo cardiomyopathy -  Her EF is back to normal. She will continue with meds as listed.  Hypertension -  The blood pressure is at target. No change in medications is indicated. We will continue with therapeutic lifestyle changes (TLC).  Permanent pacemaker - She is up to date with follow up.

## 2013-07-12 NOTE — Patient Instructions (Signed)
The current medical regimen is effective;  continue present plan and medications.  Follow up in 1 year with Dr Hochrein.  You will receive a letter in the mail 2 months before you are due.  Please call us when you receive this letter to schedule your follow up appointment.  

## 2013-09-20 ENCOUNTER — Emergency Department (HOSPITAL_BASED_OUTPATIENT_CLINIC_OR_DEPARTMENT_OTHER): Payer: Medicare HMO

## 2013-09-20 ENCOUNTER — Inpatient Hospital Stay (HOSPITAL_BASED_OUTPATIENT_CLINIC_OR_DEPARTMENT_OTHER)
Admission: EM | Admit: 2013-09-20 | Discharge: 2013-10-09 | DRG: 207 | Disposition: E | Payer: Medicare HMO | Attending: Pulmonary Disease | Admitting: Pulmonary Disease

## 2013-09-20 ENCOUNTER — Encounter: Payer: Self-pay | Admitting: *Deleted

## 2013-09-20 ENCOUNTER — Encounter (HOSPITAL_BASED_OUTPATIENT_CLINIC_OR_DEPARTMENT_OTHER): Payer: Self-pay | Admitting: Emergency Medicine

## 2013-09-20 DIAGNOSIS — E860 Dehydration: Secondary | ICD-10-CM

## 2013-09-20 DIAGNOSIS — J329 Chronic sinusitis, unspecified: Secondary | ICD-10-CM

## 2013-09-20 DIAGNOSIS — J189 Pneumonia, unspecified organism: Principal | ICD-10-CM | POA: Diagnosis present

## 2013-09-20 DIAGNOSIS — R599 Enlarged lymph nodes, unspecified: Secondary | ICD-10-CM | POA: Diagnosis present

## 2013-09-20 DIAGNOSIS — F329 Major depressive disorder, single episode, unspecified: Secondary | ICD-10-CM | POA: Diagnosis present

## 2013-09-20 DIAGNOSIS — B965 Pseudomonas (aeruginosa) (mallei) (pseudomallei) as the cause of diseases classified elsewhere: Secondary | ICD-10-CM | POA: Diagnosis present

## 2013-09-20 DIAGNOSIS — J9600 Acute respiratory failure, unspecified whether with hypoxia or hypercapnia: Secondary | ICD-10-CM | POA: Diagnosis not present

## 2013-09-20 DIAGNOSIS — I447 Left bundle-branch block, unspecified: Secondary | ICD-10-CM | POA: Diagnosis present

## 2013-09-20 DIAGNOSIS — A419 Sepsis, unspecified organism: Secondary | ICD-10-CM | POA: Diagnosis present

## 2013-09-20 DIAGNOSIS — Z66 Do not resuscitate: Secondary | ICD-10-CM | POA: Diagnosis not present

## 2013-09-20 DIAGNOSIS — IMO0002 Reserved for concepts with insufficient information to code with codable children: Secondary | ICD-10-CM | POA: Diagnosis not present

## 2013-09-20 DIAGNOSIS — R579 Shock, unspecified: Secondary | ICD-10-CM

## 2013-09-20 DIAGNOSIS — I5181 Takotsubo syndrome: Secondary | ICD-10-CM | POA: Diagnosis present

## 2013-09-20 DIAGNOSIS — F3289 Other specified depressive episodes: Secondary | ICD-10-CM | POA: Diagnosis present

## 2013-09-20 DIAGNOSIS — J471 Bronchiectasis with (acute) exacerbation: Secondary | ICD-10-CM | POA: Diagnosis present

## 2013-09-20 DIAGNOSIS — I5033 Acute on chronic diastolic (congestive) heart failure: Secondary | ICD-10-CM | POA: Diagnosis present

## 2013-09-20 DIAGNOSIS — Z515 Encounter for palliative care: Secondary | ICD-10-CM

## 2013-09-20 DIAGNOSIS — D649 Anemia, unspecified: Secondary | ICD-10-CM | POA: Diagnosis present

## 2013-09-20 DIAGNOSIS — N390 Urinary tract infection, site not specified: Secondary | ICD-10-CM | POA: Diagnosis present

## 2013-09-20 DIAGNOSIS — R6520 Severe sepsis without septic shock: Secondary | ICD-10-CM

## 2013-09-20 DIAGNOSIS — E871 Hypo-osmolality and hyponatremia: Secondary | ICD-10-CM | POA: Diagnosis present

## 2013-09-20 DIAGNOSIS — E873 Alkalosis: Secondary | ICD-10-CM | POA: Diagnosis present

## 2013-09-20 DIAGNOSIS — Z7982 Long term (current) use of aspirin: Secondary | ICD-10-CM

## 2013-09-20 DIAGNOSIS — I495 Sick sinus syndrome: Secondary | ICD-10-CM | POA: Diagnosis present

## 2013-09-20 DIAGNOSIS — R0902 Hypoxemia: Secondary | ICD-10-CM

## 2013-09-20 DIAGNOSIS — Z79899 Other long term (current) drug therapy: Secondary | ICD-10-CM

## 2013-09-20 DIAGNOSIS — R6521 Severe sepsis with septic shock: Secondary | ICD-10-CM

## 2013-09-20 DIAGNOSIS — I252 Old myocardial infarction: Secondary | ICD-10-CM

## 2013-09-20 DIAGNOSIS — R7309 Other abnormal glucose: Secondary | ICD-10-CM | POA: Diagnosis present

## 2013-09-20 DIAGNOSIS — R531 Weakness: Secondary | ICD-10-CM

## 2013-09-20 DIAGNOSIS — R0603 Acute respiratory distress: Secondary | ICD-10-CM | POA: Diagnosis present

## 2013-09-20 DIAGNOSIS — E785 Hyperlipidemia, unspecified: Secondary | ICD-10-CM | POA: Diagnosis present

## 2013-09-20 DIAGNOSIS — I2510 Atherosclerotic heart disease of native coronary artery without angina pectoris: Secondary | ICD-10-CM | POA: Diagnosis present

## 2013-09-20 DIAGNOSIS — C3490 Malignant neoplasm of unspecified part of unspecified bronchus or lung: Secondary | ICD-10-CM | POA: Diagnosis present

## 2013-09-20 DIAGNOSIS — Z950 Presence of cardiac pacemaker: Secondary | ICD-10-CM

## 2013-09-20 DIAGNOSIS — N179 Acute kidney failure, unspecified: Secondary | ICD-10-CM | POA: Diagnosis not present

## 2013-09-20 DIAGNOSIS — I509 Heart failure, unspecified: Secondary | ICD-10-CM | POA: Diagnosis present

## 2013-09-20 DIAGNOSIS — E870 Hyperosmolality and hypernatremia: Secondary | ICD-10-CM | POA: Diagnosis not present

## 2013-09-20 DIAGNOSIS — F411 Generalized anxiety disorder: Secondary | ICD-10-CM | POA: Diagnosis present

## 2013-09-20 DIAGNOSIS — I10 Essential (primary) hypertension: Secondary | ICD-10-CM | POA: Diagnosis present

## 2013-09-20 DIAGNOSIS — Z885 Allergy status to narcotic agent status: Secondary | ICD-10-CM

## 2013-09-20 LAB — CBC WITH DIFFERENTIAL/PLATELET
BASOS ABS: 0 10*3/uL (ref 0.0–0.1)
Basophils Relative: 0 % (ref 0–1)
Eosinophils Absolute: 0.2 10*3/uL (ref 0.0–0.7)
Eosinophils Relative: 1 % (ref 0–5)
HEMATOCRIT: 36.7 % (ref 36.0–46.0)
Hemoglobin: 12.5 g/dL (ref 12.0–15.0)
LYMPHS PCT: 11 % — AB (ref 12–46)
Lymphs Abs: 1.8 10*3/uL (ref 0.7–4.0)
MCH: 33 pg (ref 26.0–34.0)
MCHC: 34.1 g/dL (ref 30.0–36.0)
MCV: 96.8 fL (ref 78.0–100.0)
MONOS PCT: 15 % — AB (ref 3–12)
Monocytes Absolute: 2.5 10*3/uL — ABNORMAL HIGH (ref 0.1–1.0)
Neutro Abs: 12.3 10*3/uL — ABNORMAL HIGH (ref 1.7–7.7)
Neutrophils Relative %: 73 % (ref 43–77)
Platelets: 432 10*3/uL — ABNORMAL HIGH (ref 150–400)
RBC: 3.79 MIL/uL — ABNORMAL LOW (ref 3.87–5.11)
RDW: 12.7 % (ref 11.5–15.5)
WBC: 16.8 10*3/uL — AB (ref 4.0–10.5)

## 2013-09-20 LAB — URINE MICROSCOPIC-ADD ON

## 2013-09-20 LAB — URINALYSIS, ROUTINE W REFLEX MICROSCOPIC
BILIRUBIN URINE: NEGATIVE
Glucose, UA: NEGATIVE mg/dL
Ketones, ur: 15 mg/dL — AB
Nitrite: NEGATIVE
Protein, ur: 30 mg/dL — AB
Specific Gravity, Urine: 1.018 (ref 1.005–1.030)
Urobilinogen, UA: 1 mg/dL (ref 0.0–1.0)
pH: 6.5 (ref 5.0–8.0)

## 2013-09-20 LAB — COMPREHENSIVE METABOLIC PANEL
ALBUMIN: 3.1 g/dL — AB (ref 3.5–5.2)
ALT: 26 U/L (ref 0–35)
AST: 57 U/L — ABNORMAL HIGH (ref 0–37)
Alkaline Phosphatase: 74 U/L (ref 39–117)
BUN: 11 mg/dL (ref 6–23)
CALCIUM: 9.4 mg/dL (ref 8.4–10.5)
CO2: 21 mEq/L (ref 19–32)
Chloride: 90 mEq/L — ABNORMAL LOW (ref 96–112)
Creatinine, Ser: 0.8 mg/dL (ref 0.50–1.10)
GFR calc Af Amer: 76 mL/min — ABNORMAL LOW (ref >90)
GFR calc non Af Amer: 66 mL/min — ABNORMAL LOW (ref >90)
Glucose, Bld: 110 mg/dL — ABNORMAL HIGH (ref 70–99)
Potassium: 4.2 mEq/L (ref 3.7–5.3)
Sodium: 128 mEq/L — ABNORMAL LOW (ref 137–147)
Total Bilirubin: 0.4 mg/dL (ref 0.3–1.2)
Total Protein: 7.1 g/dL (ref 6.0–8.3)

## 2013-09-20 LAB — TROPONIN I: Troponin I: <0.3 ng/mL (ref <0.30)

## 2013-09-20 LAB — CG4 I-STAT (LACTIC ACID): LACTIC ACID, VENOUS: 1.97 mmol/L (ref 0.5–2.2)

## 2013-09-20 LAB — PROTIME-INR
INR: 1.11 (ref 0.00–1.49)
Prothrombin Time: 14.1 seconds (ref 11.6–15.2)

## 2013-09-20 LAB — PRO B NATRIURETIC PEPTIDE: Pro B Natriuretic peptide (BNP): 818.6 pg/mL — ABNORMAL HIGH (ref 0–450)

## 2013-09-20 MED ORDER — SODIUM CHLORIDE 0.9 % IV SOLN
1000.0000 mL | INTRAVENOUS | Status: DC
Start: 2013-09-20 — End: 2013-09-21
  Administered 2013-09-20: 1000 mL via INTRAVENOUS

## 2013-09-20 MED ORDER — DEXTROSE 5 % IV SOLN
1.0000 g | INTRAVENOUS | Status: DC
  Administered 2013-09-21: 1 g via INTRAVENOUS
  Filled 2013-09-20: qty 10

## 2013-09-20 MED ORDER — SODIUM CHLORIDE 0.9 % IV SOLN
1000.0000 mL | Freq: Once | INTRAVENOUS | Status: AC
  Administered 2013-09-20: 1000 mL via INTRAVENOUS

## 2013-09-20 NOTE — ED Provider Notes (Addendum)
CSN: [id number]     Arrival date & time 09/19/2013  2112 History  This chart was scribed for Kathalene Frames, MD by Ludger Nutting, ED Scribe. This patient was seen in room MH10/MH10 and the patient's care was started 9:27 PM.    Chief Complaint  Patient presents with  . Headache   The history is provided by the patient and a relative. No language interpreter was used.   HPI Comments: [name] is a 80 y.o. female with past medical history of HTN, HLD, CAD who presents to the Emergency Department complaining of constant, gradually worsened generalized weakness for the past 4 days. Pt had some cough and congestion that began 3 days ago which is gradually resolving. She was advised to take mucinex and a nasal spray. Daughter states pt has had decreased appetite with associated HA and dizziness. Pt was seen again yesterday and was given cough medicine and phenergan. Pt reports having constipation and nausea. She has a frontal HA currently. She denies weakness, numbness, aphasia.   Past Medical History  Diagnosis Date  . Hemorrhoids   . Hypertension   . Anemia   . Diverticulitis   . Hyperlipidemia   . Depression   . Anxiety   . CAD (coronary artery disease)      a. NSTEMI 7/12 with cath: dLM 20%, mLAD 60%, dLAD 30%, Dx 40%, mCFX at AV groove 30%, prox to mid RCA 30-40%, PDA 50%, EF 35% with apical ballooning and DK of the apex;      . Takotsubo cardiomyopathy     a. echo 7/12:  EF 35%, inf-sep, AS and inf HK, mild LVH, mild MR;  b. echo 10/12: EF 60%, mild LAE  . Syncope     2/2 mobitz 2  . Mobitz type 2 second degree atrioventricular block   . LBBB (left bundle branch block)   . Biventricular cardiac pacemaker in situ     St. Jude   Past Surgical History  Procedure Laterality Date  . Carpal tunnel release    . Inguinal hernia repair    . Splenectomy    . Cholecystectomy    . Appendectomy    . Tubal ligation    . Pacemaker insertion      SJM BiV pacemaker implanted 7/12 by Dr  Rayann Heman  . Trigger finger release Left 02/17/2013    Procedure: RELEASE TRIGGER FINGER/A-1 PULLEY LEFT LONG AND LEFT RING;  Surgeon: Cammie Sickle., MD;  Location: [place];  Service: Orthopedics;  Laterality: Left;   Family History  Problem Relation Age of Onset  . Coronary artery disease Neg Hx    History  Substance Use Topics  . Smoking status: Never Smoker   . Smokeless tobacco: Never Used  . Alcohol Use: No   OB History   Grav Para Term Preterm Abortions TAB SAB Ect Mult Living                 Review of Systems A complete 10 system review of systems was obtained and all systems are negative except as noted in the HPI and PMH.   Allergies  Codeine  Home Medications   Current Outpatient Rx  Name  Route  Sig  Dispense  Refill  . acetaminophen (TYLENOL) 325 MG tablet   Oral   Take 650 mg by mouth as needed.           Marland Kitchen aspirin 81 MG tablet   Oral   Take 81  mg by mouth daily.           Marland Kitchen atorvastatin (LIPITOR) 10 MG tablet   Oral   Take 5 mg by mouth daily.          . Calcium Carbonate-Vitamin D (CALCIUM 600 + D PO)   Oral   Take by mouth 2 (two) times daily.          . carvedilol (COREG) 3.125 MG tablet      TAKE 1 TABLET BY MOUTH 2 TIMES DAILY WITH A MEAL.   60 tablet   6   . diazepam (VALIUM) 5 MG tablet   Oral   Take 5 mg by mouth daily.          . ferrous sulfate (CVS IRON) 325 (65 FE) MG tablet   Oral   Take 325 mg by mouth daily with breakfast.          . fish oil-omega-3 fatty acids 1000 MG capsule   Oral   Take 2 g by mouth 2 (two) times daily.          Marland Kitchen gabapentin (NEURONTIN) 100 MG capsule   Oral   Take 100 mg by mouth 3 (three) times daily.          Marland Kitchen lisinopril-hydrochlorothiazide (PRINZIDE,ZESTORETIC) 20-12.5 MG per tablet   Oral   Take 1 tablet by mouth daily.           . multivitamin-iron-minerals-folic acid (CENTRUM) chewable tablet   Oral   Chew 1 tablet by mouth daily.           Marland Kitchen  oxyCODONE-acetaminophen (PERCOCET/ROXICET) 5-325 MG per tablet      Take 1 or 2 tablets every 4-6 hours as needed for postsurgical pain.   20 tablet   0   . Sennosides (CVS SENNA-C PO)   Oral   Take by mouth as directed.           . traZODone (DESYREL) 50 MG tablet   Oral   Take 50 mg by mouth at bedtime.           Marland Kitchen venlafaxine XR (EFFEXOR-XR) 150 MG 24 hr capsule   Oral   Take 150 mg by mouth daily.          BP 116/64  Pulse 99  Temp(Src) 99 F (37.2 C) (Oral)  Resp 18  Ht 5\' 4"  (1.626 m)  Wt 140 lb (63.504 kg)  BMI 24.02 kg/m2  SpO2 100% Physical Exam  Nursing note and vitals reviewed. Constitutional: No distress.  Frail, elderly   HENT:  Head: Normocephalic and atraumatic.  Right Ear: External ear normal.  Left Ear: External ear normal.  Mouth/Throat: Mucous membranes are dry.  Eyes: Conjunctivae are normal. Right eye exhibits no discharge. Left eye exhibits no discharge. No scleral icterus.  Neck: Neck supple. No tracheal deviation present.  Cardiovascular: Normal rate, regular rhythm and intact distal pulses.   Pulmonary/Chest: Effort normal. No stridor. No respiratory distress. She has no wheezes. She has rales (at the bases).  Abdominal: Soft. Bowel sounds are normal. She exhibits no distension. There is no tenderness. There is no rebound and no guarding.  Musculoskeletal: She exhibits no edema and no tenderness.  Neurological: She is alert. She has normal strength. No cranial nerve deficit (no facial droop, normal speech, tongue midline    ) or sensory deficit. She exhibits normal muscle tone. She displays no seizure activity. Coordination normal.  Skin: Skin is warm and dry. No rash  noted.  Psychiatric: She has a normal mood and affect.    ED Course  Procedures (including critical care time)  DIAGNOSTIC STUDIES: Oxygen Saturation is 100% on RA, normal by my interpretation.    COORDINATION OF CARE: 9:33 PM Discussed treatment plan with pt at  bedside and pt agreed to plan.   Labs Review Labs Reviewed  CBC WITH DIFFERENTIAL - Abnormal; Notable for the following:    WBC 16.8 (*)    RBC 3.79 (*)    Platelets 432 (*)    Lymphocytes Relative 11 (*)    Monocytes Relative 15 (*)    Neutro Abs 12.3 (*)    Monocytes Absolute 2.5 (*)    All other components within normal limits  COMPREHENSIVE METABOLIC PANEL - Abnormal; Notable for the following:    Sodium 128 (*)    Chloride 90 (*)    Glucose, Bld 110 (*)    Albumin 3.1 (*)    AST 57 (*)    GFR calc non Af Amer 66 (*)    GFR calc Af Amer 76 (*)    All other components within normal limits  URINALYSIS, ROUTINE W REFLEX MICROSCOPIC - Abnormal; Notable for the following:    Hgb urine dipstick SMALL (*)    Ketones, ur 15 (*)    Protein, ur 30 (*)    Leukocytes, UA SMALL (*)    All other components within normal limits  URINE MICROSCOPIC-ADD ON - Abnormal; Notable for the following:    Bacteria, UA FEW (*)    All other components within normal limits  URINE CULTURE  PROTIME-INR  PRO B NATRIURETIC PEPTIDE  TROPONIN I  INFLUENZA PANEL BY PCR (TYPE A & B, H1N1)  CG4 I-STAT (LACTIC ACID)   Imaging Review Dg Chest 2 View  09/10/2013   CLINICAL DATA:  Cough.  EXAM: CHEST  2 VIEW  COMPARISON:  09/26/2012  FINDINGS: Stable orientation of left approach biventricular pacer wires. Mild cardiomegaly. Diffuse interstitial coarsening with small bilateral pleural effusions. No asymmetric opacity or pneumothorax. Remote appearing bilateral rib fractures, new on the right since previous.  IMPRESSION: CHF.   Electronically Signed   By: Jorje Guild M.D.   On: 09/13/2013 23:01   Ct Head Wo Contrast  09/29/2013   CLINICAL DATA:  Headache.  EXAM: CT HEAD WITHOUT CONTRAST  TECHNIQUE: Contiguous axial images were obtained from the base of the skull through the vertex without intravenous contrast.  COMPARISON:  03/14/2011  FINDINGS: Skull and Sinuses:There is opacification of left ethmoid and  frontal sinus air cells. The left upper nasal cavity is also effaced/opacified, likely by mucosal thickening. These changes are new from prior. No inflammatory changes in the neighboring orbit. Left parietal scalp nodules are stable from 2012.  Brain: No evidence of acute abnormality, such as acute infarction, hemorrhage, hydrocephalus, or mass lesion/mass effect. Given the interval, essentially stable pattern of chronic small vessel ischemic injury with diffuse cerebral white matter low-attenuation. Likely previous lacunar infarcts in the right caudate head and neighboring anterior limb internal capsule. Generalized cerebral volume loss, age appropriate.  IMPRESSION: 1. No acute intracranial findings. 2. Left ethmoid and frontal sinusitis. 3. Chronic small vessel ischemia.   Electronically Signed   By: Jorje Guild M.D.   On: 10/02/2013 23:17    EKG Interpretation    Date/Time:  Tuesday September 20 2013 21:57:04 EST Ventricular Rate:  71 PR Interval:    QRS Duration: 156 QT Interval:  448 QTC Calculation: 486 R Axis:   -  85 Text Interpretation:  Ventricular-paced rhythm Biventricular pacemaker detected Abnormal ECG pacing is new since last tracing Confirmed by Axie Hayne  MD-J, Elyanah Farino ([id number]) on 10/08/2013 10:03:38 PM           Medications  0.9 %  sodium chloride infusion (0 mLs Intravenous Stopped 09/14/2013 2252)    Followed by  0.9 %  sodium chloride infusion (1,000 mLs Intravenous New Bag/Given 10/01/2013 2252)  cefTRIAXone (ROCEPHIN) 1 g in dextrose 5 % 50 mL IVPB (not administered)    MDM   1. UTI (lower urinary tract infection)   2. Hyponatremia   3. Sinusitis   4. Hypoxia    CXR suggests possible chf exacerbation.  Slight increase in troponin but overall suspect symptoms are infectious in natures.  Doubt cardiac etiology.  Will plan on admission.  IV abx.  Add on flu panel.  Blood cultures added as requested by Dr Shanon Brow   I personally performed the services described in this  documentation, which was scribed in my presence.  The recorded information has been reviewed and is accurate.   Kathalene Frames, MD 09/21/13 [id number]  Kathalene Frames, MD 09/21/13 [id number]

## 2013-09-20 NOTE — ED Notes (Signed)
Pt c/o h/a , dizziness and generalized weakness x 4 days

## 2013-09-21 DIAGNOSIS — J189 Pneumonia, unspecified organism: Principal | ICD-10-CM

## 2013-09-21 DIAGNOSIS — R531 Weakness: Secondary | ICD-10-CM | POA: Diagnosis present

## 2013-09-21 DIAGNOSIS — I509 Heart failure, unspecified: Secondary | ICD-10-CM | POA: Diagnosis present

## 2013-09-21 DIAGNOSIS — I10 Essential (primary) hypertension: Secondary | ICD-10-CM

## 2013-09-21 DIAGNOSIS — R0902 Hypoxemia: Secondary | ICD-10-CM

## 2013-09-21 DIAGNOSIS — N390 Urinary tract infection, site not specified: Secondary | ICD-10-CM | POA: Diagnosis present

## 2013-09-21 DIAGNOSIS — I319 Disease of pericardium, unspecified: Secondary | ICD-10-CM

## 2013-09-21 DIAGNOSIS — R5381 Other malaise: Secondary | ICD-10-CM

## 2013-09-21 DIAGNOSIS — R5383 Other fatigue: Secondary | ICD-10-CM

## 2013-09-21 DIAGNOSIS — E860 Dehydration: Secondary | ICD-10-CM

## 2013-09-21 DIAGNOSIS — I2510 Atherosclerotic heart disease of native coronary artery without angina pectoris: Secondary | ICD-10-CM

## 2013-09-21 DIAGNOSIS — E871 Hypo-osmolality and hyponatremia: Secondary | ICD-10-CM

## 2013-09-21 LAB — CBC
HCT: 34 % — ABNORMAL LOW (ref 36.0–46.0)
Hemoglobin: 11.6 g/dL — ABNORMAL LOW (ref 12.0–15.0)
MCH: 32.5 pg (ref 26.0–34.0)
MCHC: 34.1 g/dL (ref 30.0–36.0)
MCV: 95.2 fL (ref 78.0–100.0)
PLATELETS: 365 10*3/uL (ref 150–400)
RBC: 3.57 MIL/uL — AB (ref 3.87–5.11)
RDW: 13.1 % (ref 11.5–15.5)
WBC: 13.8 10*3/uL — AB (ref 4.0–10.5)

## 2013-09-21 LAB — TROPONIN I
Troponin I: <0.3 ng/mL (ref <0.30)
Troponin I: <0.3 ng/mL (ref <0.30)

## 2013-09-21 LAB — INFLUENZA PANEL BY PCR (TYPE A & B)
H1N1 flu by pcr: NOT DETECTED
Influenza A By PCR: NEGATIVE
Influenza B By PCR: NEGATIVE

## 2013-09-21 LAB — STREP PNEUMONIAE URINARY ANTIGEN: STREP PNEUMO URINARY ANTIGEN: NEGATIVE

## 2013-09-21 LAB — CREATININE, SERUM
CREATININE: 0.67 mg/dL (ref 0.50–1.10)
GFR, EST NON AFRICAN AMERICAN: 79 mL/min — AB (ref >90)

## 2013-09-21 LAB — GLUCOSE, CAPILLARY: GLUCOSE-CAPILLARY: 101 mg/dL — AB (ref 70–99)

## 2013-09-21 MED ORDER — SODIUM CHLORIDE 0.9 % IJ SOLN: 3.0000 mL | INTRAMUSCULAR | Status: DC | PRN

## 2013-09-21 MED ORDER — AZITHROMYCIN 500 MG PO TABS
500.0000 mg | ORAL_TABLET | Freq: Every day | ORAL | Status: DC
  Administered 2013-09-21 (×2): 500 mg via ORAL
  Filled 2013-09-21 (×2): qty 1

## 2013-09-21 MED ORDER — ATORVASTATIN CALCIUM 10 MG PO TABS
5.0000 mg | ORAL_TABLET | Freq: Every day | ORAL | Status: DC
  Administered 2013-09-22 – 2013-09-24 (×3): 5 mg via ORAL
  Administered 2013-09-25 – 2013-09-26 (×2): via ORAL
  Administered 2013-09-27 – 2013-10-04 (×8): 5 mg via ORAL
  Filled 2013-09-21 (×15): qty 0.5

## 2013-09-21 MED ORDER — OSELTAMIVIR PHOSPHATE 75 MG PO CAPS
75.0000 mg | ORAL_CAPSULE | Freq: Two times a day (BID) | ORAL | Status: DC
  Administered 2013-09-21 – 2013-09-22 (×2): 75 mg via ORAL
  Filled 2013-09-21 (×4): qty 1

## 2013-09-21 MED ORDER — AZITHROMYCIN 500 MG IV SOLR
500.0000 mg | Freq: Every day | INTRAVENOUS | Status: DC
  Filled 2013-09-21: qty 500

## 2013-09-21 MED ORDER — GABAPENTIN 100 MG PO CAPS
100.0000 mg | ORAL_CAPSULE | Freq: Three times a day (TID) | ORAL | Status: DC
  Administered 2013-09-21: 100 mg via ORAL
  Filled 2013-09-21 (×3): qty 1

## 2013-09-21 MED ORDER — SODIUM CHLORIDE 0.9 % IJ SOLN
3.0000 mL | Freq: Two times a day (BID) | INTRAMUSCULAR | Status: DC
  Administered 2013-09-21 – 2013-09-24 (×7): 3 mL via INTRAVENOUS

## 2013-09-21 MED ORDER — VENLAFAXINE HCL ER 150 MG PO CP24
150.0000 mg | ORAL_CAPSULE | Freq: Every day | ORAL | Status: DC
  Administered 2013-09-21 – 2013-09-26 (×6): 150 mg via ORAL
  Filled 2013-09-21 (×7): qty 1

## 2013-09-21 MED ORDER — GUAIFENESIN-DM 100-10 MG/5ML PO SYRP
5.0000 mL | ORAL_SOLUTION | ORAL | Status: DC | PRN
  Administered 2013-09-21: 5 mL via ORAL
  Filled 2013-09-21 (×2): qty 5

## 2013-09-21 MED ORDER — SODIUM CHLORIDE 0.9 % IV SOLN
INTRAVENOUS | Status: DC
  Administered 2013-09-21: 07:00:00 via INTRAVENOUS

## 2013-09-21 MED ORDER — FUROSEMIDE 10 MG/ML IJ SOLN
40.0000 mg | Freq: Two times a day (BID) | INTRAMUSCULAR | Status: DC
  Administered 2013-09-21 – 2013-09-23 (×5): 40 mg via INTRAVENOUS
  Filled 2013-09-21 (×6): qty 4

## 2013-09-21 MED ORDER — SODIUM CHLORIDE 0.9 % IV SOLN: 250.0000 mL | INTRAVENOUS | Status: DC | PRN

## 2013-09-21 MED ORDER — ENOXAPARIN SODIUM 40 MG/0.4ML ~~LOC~~ SOLN
40.0000 mg | Freq: Every day | SUBCUTANEOUS | Status: DC
  Administered 2013-09-21 – 2013-09-25 (×5): 40 mg via SUBCUTANEOUS
  Filled 2013-09-21 (×6): qty 0.4

## 2013-09-21 MED ORDER — FERROUS SULFATE 325 (65 FE) MG PO TABS
325.0000 mg | ORAL_TABLET | Freq: Every day | ORAL | Status: DC
  Administered 2013-09-21 – 2013-09-25 (×5): 325 mg via ORAL
  Filled 2013-09-21 (×7): qty 1

## 2013-09-21 MED ORDER — CEFTRIAXONE SODIUM 1 G IJ SOLR
INTRAMUSCULAR | Status: AC
  Filled 2013-09-21: qty 10

## 2013-09-21 MED ORDER — ACETAMINOPHEN 325 MG PO TABS
650.0000 mg | ORAL_TABLET | Freq: Four times a day (QID) | ORAL | Status: DC | PRN
  Administered 2013-09-21 – 2013-09-25 (×7): 650 mg via ORAL
  Filled 2013-09-21 (×7): qty 2

## 2013-09-21 MED ORDER — GABAPENTIN 100 MG PO CAPS
200.0000 mg | ORAL_CAPSULE | Freq: Three times a day (TID) | ORAL | Status: DC
  Administered 2013-09-21 – 2013-09-27 (×16): 200 mg via ORAL
  Filled 2013-09-21 (×21): qty 2

## 2013-09-21 MED ORDER — DIAZEPAM 5 MG PO TABS
5.0000 mg | ORAL_TABLET | Freq: Every day | ORAL | Status: DC
  Administered 2013-09-21 – 2013-09-25 (×5): 5 mg via ORAL
  Filled 2013-09-21 (×4): qty 1

## 2013-09-21 MED ORDER — AZITHROMYCIN 250 MG PO TABS
ORAL_TABLET | ORAL | Status: AC
  Filled 2013-09-21: qty 2

## 2013-09-21 MED ORDER — DEXTROSE 5 % IV SOLN
1.0000 g | Freq: Every day | INTRAVENOUS | Status: DC
  Administered 2013-09-21: 1 g via INTRAVENOUS
  Filled 2013-09-21 (×2): qty 10

## 2013-09-21 NOTE — H&P (Signed)
PCP:   RAMACHANDRAN,AJITH, MD   Chief Complaint:  cough  HPI: 80 yo female with 4 days of cough, nasal congestion, low grade temp , not feeling well, not eating well.  Went to see pcp several days ago diagnosed with a viral syndrome, flu not checked.  Coughing has persisted with more sob.  No temp over 100.  No n/v/d.  Has not eaten or drank hardly anything and just feeling very weak.  No rashes.  No le edema or swelling.  No pnd or orthopnea.  Review of Systems:  Positive and negative as per HPI otherwise all other systems are negative  Past Medical History: Past Medical History  Diagnosis Date  . Hemorrhoids   . Hypertension   . Anemia   . Diverticulitis   . Hyperlipidemia   . Depression   . Anxiety   . CAD (coronary artery disease)      a. NSTEMI 7/12 with cath: dLM 20%, mLAD 60%, dLAD 30%, Dx 40%, mCFX at AV groove 30%, prox to mid RCA 30-40%, PDA 50%, EF 35% with apical ballooning and DK of the apex;      . Takotsubo cardiomyopathy     a. echo 7/12:  EF 35%, inf-sep, AS and inf HK, mild LVH, mild MR;  b. echo 10/12: EF 60%, mild LAE  . Syncope     2/2 mobitz 2  . Mobitz type 2 second degree atrioventricular block   . LBBB (left bundle branch block)   . Biventricular cardiac pacemaker in situ     St. Jude   Past Surgical History  Procedure Laterality Date  . Carpal tunnel release    . Inguinal hernia repair    . Splenectomy    . Cholecystectomy    . Appendectomy    . Tubal ligation    . Pacemaker insertion      SJM BiV pacemaker implanted 7/12 by Dr Rayann Heman  . Trigger finger release Left 02/17/2013    Procedure: RELEASE TRIGGER FINGER/A-1 PULLEY LEFT LONG AND LEFT RING;  Surgeon: Cammie Sickle., MD;  Location: [facility];  Service: Orthopedics;  Laterality: Left;    Medications: Prior to Admission medications   Medication Sig Start Date End Date Taking? Authorizing Provider  acetaminophen (TYLENOL) 325 MG tablet Take 650 mg by mouth as needed.       Historical Provider, MD  aspirin 81 MG tablet Take 81 mg by mouth daily.      Historical Provider, MD  atorvastatin (LIPITOR) 10 MG tablet Take 5 mg by mouth daily.     Historical Provider, MD  Calcium Carbonate-Vitamin D (CALCIUM 600 + D PO) Take by mouth 2 (two) times daily.     Historical Provider, MD  carvedilol (COREG) 3.125 MG tablet TAKE 1 TABLET BY MOUTH 2 TIMES DAILY WITH A MEAL. 06/23/13   Thompson Grayer, MD  diazepam (VALIUM) 5 MG tablet Take 5 mg by mouth daily.     Historical Provider, MD  ferrous sulfate ([employer] IRON) 325 (65 FE) MG tablet Take 325 mg by mouth daily with breakfast.     Historical Provider, MD  fish oil-omega-3 fatty acids 1000 MG capsule Take 2 g by mouth 2 (two) times daily.     Historical Provider, MD  gabapentin (NEURONTIN) 100 MG capsule Take 100 mg by mouth 3 (three) times daily.  06/11/12   Historical Provider, MD  lisinopril-hydrochlorothiazide (PRINZIDE,ZESTORETIC) 20-12.5 MG per tablet Take 1 tablet by mouth daily.  Historical Provider, MD  multivitamin-iron-minerals-folic acid (CENTRUM) chewable tablet Chew 1 tablet by mouth daily.      Historical Provider, MD  oxyCODONE-acetaminophen (PERCOCET/ROXICET) 5-325 MG per tablet Take 1 or 2 tablets every 4-6 hours as needed for postsurgical pain. 02/17/13   Cammie Sickle., MD  Sennosides ([employer] SENNA-C PO) Take by mouth as directed.      Historical Provider, MD  traZODone (DESYREL) 50 MG tablet Take 50 mg by mouth at bedtime.      Historical Provider, MD  venlafaxine XR (EFFEXOR-XR) 150 MG 24 hr capsule Take 150 mg by mouth daily.    Historical Provider, MD    Allergies:   Allergies  Allergen Reactions  . Codeine     Social History:  reports that she has never smoked. She has never used smokeless tobacco. She reports that she does not drink alcohol or use illicit drugs.  Family History: Family History  Problem Relation Age of Onset  . Coronary artery disease Neg Hx     Physical Exam: Filed  Vitals:   09/29/2013 2117 09/21/13 0047 09/21/13 0220  BP: 116/64 132/67 113/54  Pulse: 99 86 70  Temp: 99 F (37.2 C) 98.7 F (37.1 C) 98.2 F (36.8 C)  TempSrc: Oral Oral Oral  Resp: 18 18 18   Height: 5\' 4"  (1.626 m)  5\' 4"  (1.626 m)  Weight: 63.504 kg (140 lb)  65.499 kg (144 lb 6.4 oz)  SpO2: 100% 94% 95%   General appearance: alert, cooperative and no distress dry Head: Normocephalic, without obvious abnormality, atraumatic Eyes: negative Nose: Nares normal. Septum midline. Mucosa normal. No drainage or sinus tenderness. Neck: no JVD and supple, symmetrical, trachea midline Lungs: clear to auscultation bilaterally Heart: regular rate and rhythm, S1, S2 normal, no murmur, click, rub or gallop Abdomen: soft, non-tender; bowel sounds normal; no masses,  no organomegaly Extremities: extremities normal, atraumatic, no cyanosis or edema Pulses: 2+ and symmetric Skin: Skin color, texture, turgor normal. No rashes or lesions Neurologic: Grossly normal    Labs on Admission:   Recent Labs  09/13/2013 2135  NA 128*  K 4.2  CL 90*  CO2 21  GLUCOSE 110*  BUN 11  CREATININE 0.80  CALCIUM 9.4    Recent Labs  09/24/2013 2135  AST 57*  ALT 26  ALKPHOS 74  BILITOT 0.4  PROT 7.1  ALBUMIN 3.1*    Recent Labs  09/26/2013 2135  WBC 16.8*  NEUTROABS 12.3*  HGB 12.5  HCT 36.7  MCV 96.8  PLT 432*    Recent Labs  09/13/2013 2317  TROPONINI <0.30   Radiological Exams on Admission: Dg Chest 2 View  09/25/2013   CLINICAL DATA:  Cough.  EXAM: CHEST  2 VIEW  COMPARISON:  09/26/2012  FINDINGS: Stable orientation of left approach biventricular pacer wires. Mild cardiomegaly. Diffuse interstitial coarsening with small bilateral pleural effusions. No asymmetric opacity or pneumothorax. Remote appearing bilateral rib fractures, new on the right since previous.  IMPRESSION: CHF.   Electronically Signed   By: Jorje Guild M.D.   On: 09/12/2013 23:01   Ct Head Wo  Contrast  09/20/2013   CLINICAL DATA:  Headache.  EXAM: CT HEAD WITHOUT CONTRAST  TECHNIQUE: Contiguous axial images were obtained from the base of the skull through the vertex without intravenous contrast.  COMPARISON:  03/14/2011  FINDINGS: Skull and Sinuses:There is opacification of left ethmoid and frontal sinus air cells. The left upper nasal cavity is also effaced/opacified, likely by mucosal thickening.  These changes are new from prior. No inflammatory changes in the neighboring orbit. Left parietal scalp nodules are stable from 2012.  Brain: No evidence of acute abnormality, such as acute infarction, hemorrhage, hydrocephalus, or mass lesion/mass effect. Given the interval, essentially stable pattern of chronic small vessel ischemic injury with diffuse cerebral white matter low-attenuation. Likely previous lacunar infarcts in the right caudate head and neighboring anterior limb internal capsule. Generalized cerebral volume loss, age appropriate.  IMPRESSION: 1. No acute intracranial findings. 2. Left ethmoid and frontal sinusitis. 3. Chronic small vessel ischemia.   Electronically Signed   By: Jorje Guild M.D.   On: 09/11/2013 23:17    Assessment/Plan 80 yo female with probable atypical pna ??influenza  Principal Problem:   PNA (pneumonia)-  Mildly hypoxic.  Quick flu is pending, out the window for tamiflu.  Place on pna pathway, rocephin and azithro.  Oxygen supplementation prn.  Robitussin.  Active Problems:   CAD (coronary artery disease) atable   Takotsubo cardiomyopathy stable   PPM-St.Jude stable   Hypertension stable   UTI (urinary tract infection) rocephin   Weakness generalized due to above and dehydration.  ivf   Dehydration  ivf gentle overnight.    Tiffany Calmes A 09/21/2013, 2:28 AM

## 2013-09-21 NOTE — Progress Notes (Signed)
UR completed. Patient changed to inpatient r/t requiring IV antibiotics.  

## 2013-09-21 NOTE — Progress Notes (Signed)
Echo Lab  2D Echocardiogram completed.  [place], RDCS 09/21/2013 2:36 PM

## 2013-09-21 NOTE — Progress Notes (Signed)
I have seen and assessed patient and I agree with Dr. Camelia Eng assessment and plan.we'll place patient empirically on Tamiflu.chest x-ray concerning for acute CHF exacerbation. Cycle cardiac enzymes every 6 hours x3. Check a 2-D echo. Place on IV Lasix. Continue empiric antibiotics for now. Follow.

## 2013-09-21 NOTE — Progress Notes (Signed)
Patient is requesting Tylenol. None ordered. On call MD paged. Awaiting for call back. Will continue to monitor.  Ave Filter, RN

## 2013-09-21 NOTE — ED Notes (Signed)
Blood Cultures were drawn prior to starting antibiotics.

## 2013-09-22 LAB — CBC WITH DIFFERENTIAL/PLATELET
BASOS PCT: 1 % (ref 0–1)
Basophils Absolute: 0.1 10*3/uL (ref 0.0–0.1)
EOS ABS: 0.5 10*3/uL (ref 0.0–0.7)
EOS PCT: 3 % (ref 0–5)
HCT: 34.9 % — ABNORMAL LOW (ref 36.0–46.0)
Hemoglobin: 11.9 g/dL — ABNORMAL LOW (ref 12.0–15.0)
LYMPHS ABS: 1.6 10*3/uL (ref 0.7–4.0)
Lymphocytes Relative: 11 % — ABNORMAL LOW (ref 12–46)
MCH: 32.6 pg (ref 26.0–34.0)
MCHC: 34.1 g/dL (ref 30.0–36.0)
MCV: 95.6 fL (ref 78.0–100.0)
Monocytes Absolute: 2.9 10*3/uL — ABNORMAL HIGH (ref 0.1–1.0)
Monocytes Relative: 19 % — ABNORMAL HIGH (ref 3–12)
NEUTROS PCT: 67 % (ref 43–77)
Neutro Abs: 10.3 10*3/uL — ABNORMAL HIGH (ref 1.7–7.7)
PLATELETS: 408 10*3/uL — AB (ref 150–400)
RBC: 3.65 MIL/uL — AB (ref 3.87–5.11)
RDW: 12.9 % (ref 11.5–15.5)
WBC: 15.3 10*3/uL — ABNORMAL HIGH (ref 4.0–10.5)

## 2013-09-22 LAB — BASIC METABOLIC PANEL
BUN: 13 mg/dL (ref 6–23)
CALCIUM: 8.9 mg/dL (ref 8.4–10.5)
CO2: 27 meq/L (ref 19–32)
CREATININE: 0.7 mg/dL (ref 0.50–1.10)
Chloride: 92 mEq/L — ABNORMAL LOW (ref 96–112)
GFR calc Af Amer: 90 mL/min — ABNORMAL LOW (ref >90)
GFR, EST NON AFRICAN AMERICAN: 77 mL/min — AB (ref >90)
Glucose, Bld: 91 mg/dL (ref 70–99)
Potassium: 3.4 mEq/L — ABNORMAL LOW (ref 3.7–5.3)
SODIUM: 133 meq/L — AB (ref 137–147)

## 2013-09-22 LAB — LEGIONELLA ANTIGEN, URINE: Legionella Antigen, Urine: NEGATIVE

## 2013-09-22 MED ORDER — ASPIRIN EC 81 MG PO TBEC
81.0000 mg | DELAYED_RELEASE_TABLET | Freq: Every day | ORAL | Status: DC
  Administered 2013-09-22 – 2013-09-26 (×5): 81 mg via ORAL
  Filled 2013-09-22 (×6): qty 1

## 2013-09-22 MED ORDER — POTASSIUM CHLORIDE CRYS ER 20 MEQ PO TBCR
40.0000 meq | EXTENDED_RELEASE_TABLET | Freq: Once | ORAL | Status: AC
  Administered 2013-09-22: 40 meq via ORAL
  Filled 2013-09-22 (×2): qty 2

## 2013-09-22 MED ORDER — LEVOFLOXACIN 750 MG PO TABS
750.0000 mg | ORAL_TABLET | ORAL | Status: DC
  Filled 2013-09-22: qty 1

## 2013-09-22 MED ORDER — LEVOFLOXACIN 750 MG PO TABS
750.0000 mg | ORAL_TABLET | ORAL | Status: DC
  Administered 2013-09-23 – 2013-09-25 (×2): 750 mg via ORAL
  Filled 2013-09-22 (×2): qty 1

## 2013-09-22 MED ORDER — OSELTAMIVIR PHOSPHATE 30 MG PO CAPS
30.0000 mg | ORAL_CAPSULE | Freq: Two times a day (BID) | ORAL | Status: DC
  Administered 2013-09-22 – 2013-09-25 (×6): 30 mg via ORAL
  Filled 2013-09-22 (×9): qty 1

## 2013-09-22 MED ORDER — ONDANSETRON HCL 4 MG/2ML IJ SOLN
4.0000 mg | Freq: Four times a day (QID) | INTRAMUSCULAR | Status: DC | PRN
  Administered 2013-09-23 – 2013-09-24 (×2): 4 mg via INTRAVENOUS
  Filled 2013-09-22 (×2): qty 2

## 2013-09-22 MED ORDER — CARVEDILOL 3.125 MG PO TABS
3.1250 mg | ORAL_TABLET | Freq: Two times a day (BID) | ORAL | Status: DC
  Administered 2013-09-23 – 2013-09-25 (×6): 3.125 mg via ORAL
  Filled 2013-09-22 (×9): qty 1

## 2013-09-22 MED ORDER — OMEGA-3-ACID ETHYL ESTERS 1 G PO CAPS
1.0000 g | ORAL_CAPSULE | Freq: Two times a day (BID) | ORAL | Status: DC
  Administered 2013-09-22 – 2013-09-25 (×6): 1 g via ORAL
  Filled 2013-09-22 (×7): qty 1

## 2013-09-22 MED ORDER — ASPIRIN 81 MG PO TABS: 81.0000 mg | ORAL_TABLET | Freq: Every day | ORAL | Status: DC

## 2013-09-22 MED ORDER — OMEGA-3 FATTY ACIDS 1000 MG PO CAPS: 2.0000 g | ORAL_CAPSULE | Freq: Two times a day (BID) | ORAL | Status: DC

## 2013-09-22 MED ORDER — ONDANSETRON HCL 4 MG/2ML IJ SOLN
4.0000 mg | Freq: Four times a day (QID) | INTRAMUSCULAR | Status: DC
  Administered 2013-09-22: 4 mg via INTRAVENOUS

## 2013-09-22 NOTE — Progress Notes (Signed)
[facility] PROGRESS NOTE  RACINE ERBY YPP:[id number] DOB: 1927-02-19 DOA: 09/26/2013 PCP: Merrilee Seashore, MD  Assessment/Plan: #1. Fever/upper respiratory symptoms Questionable etiology. Concern for pneumonia versus flu like syndrome. Influenza panel is negative. Urine Legionella antigen is negative. Urine pneumococcus antigen is negative.will change IV azithromycin and IV Rocephin to oral Levaquin tomorrow. Continue empiric Tamiflu. IV fluids. Supportive care.  #2 probable pneumonia Clinical improvement. Urine Legionella and urine pneumococcus antigen is negative. Blood cultures pending.Change IV azithromycin and IV Rocephin to oral Levaquin to complete a course of therapy.follow.  #3 acute diastolic CHF exacerbation Clinical improvement. I/O = -1.667 mL. Cardiac enzymes negative x3. 2-D echo with EF of 50-55% with no wall motion abnormalities. Continue IV Lasix. Resume home regimen of Coreg.  #4 leukocytosis Probably secondary to #1 and #2. Blood cultures pending. Urine cultures pending. Continue empiric antibiotics. Follow.  #5 coronary artery disease/ PPM Stable. Continue diuretics. Resume Coreg.  #6 probable UTI Urine cultures pending. On IV antibiotics.  #7 depression Stable. Continue Effexor.  #8 prophylaxis SCDs for DVT prophylaxis.   Code Status: Full Family Communication: updated the patient. Disposition Plan: home when medically stable   Consultants:  none  Procedures:  2-D echo 09/21/2013  CT at 09/22/2013   chest x-ray 09/23/2013    Antibiotics:  IV azithromycin 09/21/2013  IV Rocephin 09/21/2013  HPI/Subjective: Patient states feeling better.  Objective: Filed Vitals:   09/22/13 1051  BP: 144/65  Pulse: 97  Temp: 97.9 F (36.6 C)  Resp: 20    Intake/Output Summary (Last 24 hours) at 09/22/13 1213 Last data filed at 09/22/13 0534  Gross per 24 hour  Intake    413 ml  Output   1600 ml  Net  -1187 ml   Filed  Weights   09/09/2013 2117 09/21/13 0220  Weight: 63.504 kg (140 lb) 65.499 kg (144 lb 6.4 oz)    Exam:   General:  NAD  Cardiovascular: RRR  Respiratory: CTAB  Abdomen: Soft/NT/ND/+BS  Musculoskeletal: No c/c/e   Data Reviewed: Basic Metabolic Panel:  Recent Labs Lab 09/21/2013 2135 09/21/13 0610 09/22/13 0313  NA 128*  --  133*  K 4.2  --  3.4*  CL 90*  --  92*  CO2 21  --  27  GLUCOSE 110*  --  91  BUN 11  --  13  CREATININE 0.80 0.67 0.70  CALCIUM 9.4  --  8.9   Liver Function Tests:  Recent Labs Lab 10/05/2013 2135  AST 57*  ALT 26  ALKPHOS 74  BILITOT 0.4  PROT 7.1  ALBUMIN 3.1*   No results found for this basename: LIPASE, AMYLASE,  in the last 168 hours No results found for this basename: AMMONIA,  in the last 168 hours CBC:  Recent Labs Lab 09/22/2013 2135 09/21/13 0610 09/22/13 0313  WBC 16.8* 13.8* 15.3*  NEUTROABS 12.3*  --  10.3*  HGB 12.5 11.6* 11.9*  HCT 36.7 34.0* 34.9*  MCV 96.8 95.2 95.6  PLT 432* 365 408*   Cardiac Enzymes:  Recent Labs Lab 09/24/2013 2317 09/21/13 0909 09/21/13 1404 09/21/13 2011  TROPONINI <0.30 <0.30 <0.30 <0.30   BNP (last 3 results)  Recent Labs  09/08/2013 2317  PROBNP 818.6*   CBG:  Recent Labs Lab 09/21/13 2053  GLUCAP 101*    Recent Results (from the past 240 hour(s))  URINE CULTURE     Status: None   Collection Time    09/09/2013 11:05 PM      Result Value  Range Status   Specimen Description URINE, CLEAN CATCH   Final   Special Requests NONE   Final   Culture  Setup Time     Final   Value: 09/21/2013 06:13     Performed at [facility] PENDING   Incomplete   Culture     Final   Value: Culture reincubated for better growth     Performed at [facility]   Report Status PENDING   Incomplete  CULTURE, BLOOD (ROUTINE X 2)     Status: None   Collection Time    09/21/13 12:15 AM      Result Value Range Status   Specimen Description BLOOD LEFT ANTECUBITAL    Final   Special Requests     Final   Value: BOTTLES DRAWN AEROBIC AND ANAEROBIC AER 5cc ANA 5cc   Culture  Setup Time     Final   Value: 09/21/2013 02:20     Performed at [facility]   Culture     Final   Value:        BLOOD CULTURE RECEIVED NO GROWTH TO DATE CULTURE WILL BE HELD FOR 5 DAYS BEFORE ISSUING A FINAL NEGATIVE REPORT     Performed at [facility]   Report Status PENDING   Incomplete  CULTURE, BLOOD (ROUTINE X 2)     Status: None   Collection Time    09/21/13 12:15 AM      Result Value Range Status   Specimen Description BLOOD RIGHT ANTECUBITAL   Final   Special Requests     Final   Value: BOTTLES DRAWN AEROBIC AND ANAEROBIC AER 5cc ANA 5cc   Culture  Setup Time     Final   Value: 09/21/2013 02:20     Performed at [facility]   Culture     Final   Value:        BLOOD CULTURE RECEIVED NO GROWTH TO DATE CULTURE WILL BE HELD FOR 5 DAYS BEFORE ISSUING A FINAL NEGATIVE REPORT     Performed at [facility]   Report Status PENDING   Incomplete  CULTURE, BLOOD (ROUTINE X 2)     Status: None   Collection Time    09/21/13  6:10 AM      Result Value Range Status   Specimen Description BLOOD RIGHT ARM   Final   Special Requests BOTTLES DRAWN AEROBIC AND ANAEROBIC 10CC EACH   Final   Culture  Setup Time     Final   Value: 09/21/2013 12:14     Performed at [facility]   Culture     Final   Value:        BLOOD CULTURE RECEIVED NO GROWTH TO DATE CULTURE WILL BE HELD FOR 5 DAYS BEFORE ISSUING A FINAL NEGATIVE REPORT     Performed at [facility]   Report Status PENDING   Incomplete  CULTURE, BLOOD (ROUTINE X 2)     Status: None   Collection Time    09/21/13  6:18 AM      Result Value Range Status   Specimen Description BLOOD RIGHT ARM   Final   Special Requests BOTTLES DRAWN AEROBIC AND ANAEROBIC 10CC EACH   Final   Culture  Setup Time     Final   Value: 09/21/2013 12:14     Performed at [facility]   Culture      Final   Value:  BLOOD CULTURE RECEIVED NO GROWTH TO DATE CULTURE WILL BE HELD FOR 5 DAYS BEFORE ISSUING A FINAL NEGATIVE REPORT     Performed at [facility]   Report Status PENDING   Incomplete     Studies: Dg Chest 2 View  09/19/2013   CLINICAL DATA:  Cough.  EXAM: CHEST  2 VIEW  COMPARISON:  09/26/2012  FINDINGS: Stable orientation of left approach biventricular pacer wires. Mild cardiomegaly. Diffuse interstitial coarsening with small bilateral pleural effusions. No asymmetric opacity or pneumothorax. Remote appearing bilateral rib fractures, new on the right since previous.  IMPRESSION: CHF.   Electronically Signed   By: Jorje Guild M.D.   On: 10/08/2013 23:01   Ct Head Wo Contrast  09/30/2013   CLINICAL DATA:  Headache.  EXAM: CT HEAD WITHOUT CONTRAST  TECHNIQUE: Contiguous axial images were obtained from the base of the skull through the vertex without intravenous contrast.  COMPARISON:  03/14/2011  FINDINGS: Skull and Sinuses:There is opacification of left ethmoid and frontal sinus air cells. The left upper nasal cavity is also effaced/opacified, likely by mucosal thickening. These changes are new from prior. No inflammatory changes in the neighboring orbit. Left parietal scalp nodules are stable from 2012.  Brain: No evidence of acute abnormality, such as acute infarction, hemorrhage, hydrocephalus, or mass lesion/mass effect. Given the interval, essentially stable pattern of chronic small vessel ischemic injury with diffuse cerebral white matter low-attenuation. Likely previous lacunar infarcts in the right caudate head and neighboring anterior limb internal capsule. Generalized cerebral volume loss, age appropriate.  IMPRESSION: 1. No acute intracranial findings. 2. Left ethmoid and frontal sinusitis. 3. Chronic small vessel ischemia.   Electronically Signed   By: Jorje Guild M.D.   On: 09/26/2013 23:17    Scheduled Meds: . atorvastatin  5 mg Oral Daily  .  azithromycin  500 mg Oral Daily  . cefTRIAXone (ROCEPHIN)  IV  1 g Intravenous QHS  . diazepam  5 mg Oral Daily  . enoxaparin (LOVENOX) injection  40 mg Subcutaneous Daily  . ferrous sulfate  325 mg Oral Q breakfast  . furosemide  40 mg Intravenous Q12H  . gabapentin  200 mg Oral TID  . oseltamivir  75 mg Oral BID  . potassium chloride  40 mEq Oral Once  . sodium chloride  3 mL Intravenous Q12H  . venlafaxine XR  150 mg Oral Daily   Continuous Infusions:   Principal Problem:   PNA (pneumonia) Active Problems:   CAD (coronary artery disease)   Takotsubo cardiomyopathy   PPM-St.Jude   Hypertension   UTI (urinary tract infection)   Weakness generalized   Dehydration   CHF exacerbation    Time spent: 35 mins    [facility] Pager [phone]. If 7PM-7AM, please contact night-coverage at [email or web address], password [facility] 09/22/2013, 12:13 PM  LOS: 2 days

## 2013-09-23 ENCOUNTER — Inpatient Hospital Stay (HOSPITAL_COMMUNITY): Payer: Medicare HMO

## 2013-09-23 LAB — CBC
HCT: 37.7 % (ref 36.0–46.0)
HCT: 35.8 % — ABNORMAL LOW (ref 36.0–46.0)
HEMOGLOBIN: 11.9 g/dL — AB (ref 12.0–15.0)
Hemoglobin: 13 g/dL (ref 12.0–15.0)
MCH: 33.1 pg (ref 26.0–34.0)
MCH: 32.2 pg (ref 26.0–34.0)
MCHC: 34.5 g/dL (ref 30.0–36.0)
MCHC: 33.2 g/dL (ref 30.0–36.0)
MCV: 95.9 fL (ref 78.0–100.0)
MCV: 96.8 fL (ref 78.0–100.0)
PLATELETS: 408 10*3/uL — AB (ref 150–400)
PLATELETS: 372 10*3/uL (ref 150–400)
RBC: 3.93 MIL/uL (ref 3.87–5.11)
RBC: 3.7 MIL/uL — AB (ref 3.87–5.11)
RDW: 13 % (ref 11.5–15.5)
RDW: 13.1 % (ref 11.5–15.5)
WBC: 14.6 10*3/uL — AB (ref 4.0–10.5)
WBC: 15.6 10*3/uL — AB (ref 4.0–10.5)

## 2013-09-23 LAB — BASIC METABOLIC PANEL
BUN: 14 mg/dL (ref 6–23)
BUN: 15 mg/dL (ref 6–23)
CALCIUM: 9 mg/dL (ref 8.4–10.5)
CHLORIDE: 92 meq/L — AB (ref 96–112)
CO2: 26 mEq/L (ref 19–32)
CO2: 22 meq/L (ref 19–32)
CREATININE: 0.87 mg/dL (ref 0.50–1.10)
Calcium: 8.2 mg/dL — ABNORMAL LOW (ref 8.4–10.5)
Chloride: 90 mEq/L — ABNORMAL LOW (ref 96–112)
Creatinine, Ser: 0.82 mg/dL (ref 0.50–1.10)
GFR calc Af Amer: 69 mL/min — ABNORMAL LOW (ref >90)
GFR calc Af Amer: 74 mL/min — ABNORMAL LOW (ref >90)
GFR calc non Af Amer: 64 mL/min — ABNORMAL LOW (ref >90)
GFR, EST NON AFRICAN AMERICAN: 59 mL/min — AB (ref >90)
Glucose, Bld: 108 mg/dL — ABNORMAL HIGH (ref 70–99)
Glucose, Bld: 181 mg/dL — ABNORMAL HIGH (ref 70–99)
POTASSIUM: 3.7 meq/L (ref 3.7–5.3)
Potassium: 4.8 mEq/L (ref 3.7–5.3)
SODIUM: 126 meq/L — AB (ref 137–147)
Sodium: 131 mEq/L — ABNORMAL LOW (ref 137–147)

## 2013-09-23 LAB — GLUCOSE, CAPILLARY: GLUCOSE-CAPILLARY: 198 mg/dL — AB (ref 70–99)

## 2013-09-23 MED ORDER — SODIUM CHLORIDE 0.9 % IV BOLUS (SEPSIS)
500.0000 mL | Freq: Once | INTRAVENOUS | Status: AC
  Administered 2013-09-23: 500 mL via INTRAVENOUS

## 2013-09-23 MED ORDER — SODIUM CHLORIDE 0.9 % IV SOLN
INTRAVENOUS | Status: DC
  Administered 2013-09-23: 1000 mL via INTRAVENOUS
  Administered 2013-09-24: 950 mL via INTRAVENOUS

## 2013-09-23 MED ORDER — POLYETHYLENE GLYCOL 3350 17 G PO PACK
17.0000 g | PACK | Freq: Two times a day (BID) | ORAL | Status: DC
  Administered 2013-09-23 – 2013-09-28 (×8): 17 g via ORAL
  Filled 2013-09-23 (×11): qty 1

## 2013-09-23 NOTE — Evaluation (Signed)
Occupational Therapy Evaluation Patient Details Name: [name] MRN: [id number] DOB: 1926-12-07 Today's Date: 09/23/2013 Time: 2706-2376 OT Time Calculation (min): 14 min  OT Assessment / Plan / Recommendation History of present illness 80 yo female with 4 days of cough, nasal congestion, low grade temp , not feeling well, not eating well.  Went to see pcp several days ago diagnosed with a viral syndrome, flu not checked.  Coughing has persisted with more sob.  No temp over 100.  No n/v/d.  Has not eaten or drank hardly anything and just feeling very weak.  No rashes.  No le edema or swelling.  No pnd or orthopnea.   Clinical Impression   Pt admitted with above.  Pt presenting with generalized weakness. Will benefit from continued acute OT services to address below problem list in prep for return home with family assist.      OT Assessment  Patient needs continued OT Services    Follow Up Recommendations  No OT follow up;Supervision/Assistance - 24 hour    Barriers to Discharge      Equipment Recommendations   (TBD)    Recommendations for Other Services    Frequency  Min 2X/week    Precautions / Restrictions Precautions Precautions: Other (comment) Precaution Comments: monitor O2   Pertinent Vitals/Pain Pt on RA on OT arrival, and SpO2 ranging 86-89% at rest.  Pt recovered to 91% on 3L O2. RN made aware.    ADL  Grooming: Performed;Wash/dry hands;Supervision/safety Where Assessed - Grooming: Unsupported standing Lower Body Dressing: Performed;Supervision/safety Where Assessed - Lower Body Dressing: Unsupported sit to stand Toilet Transfer: Performed;Supervision/safety Toilet Transfer Method: Sit to Loss adjuster, chartered: Comfort height toilet Toileting - Clothing Manipulation and Hygiene: Performed;Supervision/safety Where Assessed - Best boy and Hygiene: Sit to stand from 3-in-1 or toilet Equipment Used:   (O2) Transfers/Ambulation Related to ADLs: close supervision for safety. No physical assist needed. ADL Comments: Pt generally weak compared to baseline.  Pt stating she plans to stay with daughter in law initially at discharge.  Agree with pt that this is safest plan since pt will require 24/7 supervision until she returns to baseline.    OT Diagnosis: Generalized weakness  OT Problem List: Decreased strength;Decreased activity tolerance;Cardiopulmonary status limiting activity;Decreased knowledge of use of DME or AE OT Treatment Interventions: Self-care/ADL training;Energy conservation;DME and/or AE instruction;Therapeutic activities;Patient/family education   OT Goals(Current goals can be found in the care plan section) Acute Rehab OT Goals Patient Stated Goal: to return home OT Goal Formulation: With patient Time For Goal Achievement: 09/30/13 Potential to Achieve Goals: Good  Visit Information  Last OT Received On: 09/23/13 Assistance Needed: +1 History of Present Illness: 80 yo female with 4 days of cough, nasal congestion, low grade temp , not feeling well, not eating well.  Went to see pcp several days ago diagnosed with a viral syndrome, flu not checked.  Coughing has persisted with more sob.  No temp over 100.  No n/v/d.  Has not eaten or drank hardly anything and just feeling very weak.  No rashes.  No le edema or swelling.  No pnd or orthopnea.       Prior [place] expects to be discharged to:: Private residence Living Arrangements: Alone Available Help at Discharge: Friend(s);Available PRN/intermittently Type of Home: House Home Access: Stairs to enter [employer] of Steps: 2 Entrance Stairs-Rails: Right;Left Home Layout: One level Home Equipment: Bedside commode Additional Comments: Pt states she  plans to stay with her daughter in law initially in order to have increased level of assist. Prior Function Level of  Independence: Independent Dominant Hand: Right         Vision/Perception     Cognition  Cognition Arousal/Alertness: Awake/alert Behavior During Therapy: WFL for tasks assessed/performed Overall Cognitive Status: Within Functional Limits for tasks assessed    Extremity/Trunk Assessment Upper Extremity Assessment Upper Extremity Assessment: Generalized weakness Lower Extremity Assessment Lower Extremity Assessment: Generalized weakness     Mobility Bed Mobility Overal bed mobility: Modified Independent General bed mobility comments: increased time to perform Transfers Overall transfer level: Modified independent General transfer comment: increased time to perform, no physical assist required at this time     Exercise     Balance Balance Overall balance assessment: No apparent balance deficits (not formally assessed)   End of Session OT - End of Session Equipment Utilized During Treatment: Oxygen Activity Tolerance: Patient tolerated treatment well Patient left: in chair;with call bell/phone within reach Nurse Communication: Mobility status  GO    09/23/2013 Darrol Jump OTR/L Pager [phone] Office [phone]  Darrol Jump 09/23/2013, 10:07 AM

## 2013-09-23 NOTE — Progress Notes (Signed)
[facility] PROGRESS NOTE  [name] MWU:[id number] DOB: [birth year] DOA: 09/26/2013 PCP: Merrilee Seashore, MD  Assessment/Plan: #1. Fever/upper respiratory symptoms Questionable etiology. Concern for pneumonia versus flu like syndrome. Influenza panel is negative. Urine Legionella antigen is negative. Urine pneumococcus antigen is negative. Continue oral Levaquin. Continue empiric Tamiflu. IV fluids. Supportive care.  #2 probable pneumonia Clinical improvement. Urine Legionella and urine pneumococcus antigen is negative. Blood cultures pending.Changed IV azithromycin and IV Rocephin to oral Levaquin to complete a course of therapy.follow.  #3 acute diastolic CHF exacerbation Clinical improvement. I/O = -1.667 mL. Cardiac enzymes negative x3. 2-D echo with EF of 50-55% with no wall motion abnormalities. Patient with some generalized weakness likely secondary to orthostasis. Discontinue IV Lasix. Continue home regimen of Coreg.  #4 leukocytosis Probably secondary to #1 and #2. Blood cultures pending. Urine cultures pending. Continue empiric antibiotics. Follow.  #5 coronary artery disease/ PPM Stable. Discontinue diuretics secondary to generalized weakness and probable also with stasis.Coninue  Coreg.  #6 probable UTI Urine cultures with 10,000 colonies./ On IV antibiotics.  #7 depression Stable. Continue Effexor.  #8 generalized weakness Will check orthostatics. Discontinue IV Lasix. IV fluids. KUB. Follow.  #9 prophylaxis SCDs for DVT prophylaxis.   Code Status: Full Family Communication: updated the patient. Disposition Plan: home when medically stable   Consultants:  none  Procedures:  2-D echo 09/21/2013  CT at 10/01/2013   chest x-ray 09/11/2013    Antibiotics:  IV azithromycin 09/21/2013---> 09/22/2013  IV Rocephin 09/21/2013---> 09/22/2013  Oral Levaquin 09/23/2013  HPI/Subjective: Patient states feeling better earlier this  morning. Later on this afternoon patient complaining of weakness and nausea with a little bit of emesis after eating lunch.  Objective: Filed Vitals:   09/23/13 1817  BP: 105/50  Pulse: 98  Temp: 98.3 F (36.8 C)  Resp: 18    Intake/Output Summary (Last 24 hours) at 09/23/13 2155 Last data filed at 09/23/13 1730  Gross per 24 hour  Intake    620 ml  Output      0 ml  Net    620 ml   [facility] Weights   09/18/2013 2117 09/21/13 0220  Weight: 63.504 kg (140 lb) 65.499 kg (144 lb 6.4 oz)    Exam:   General:  NAD  Cardiovascular: RRR  Respiratory: CTAB  Abdomen: Soft/NT/ND/+BS  Musculoskeletal: No c/c/e   Data Reviewed: Basic Metabolic Panel:  Recent Labs Lab 09/12/2013 2135 09/21/13 0610 09/22/13 0313 09/23/13 0443 09/23/13 1510  NA 128*  --  133* 131* 126*  K 4.2  --  3.4* 4.8 3.7  CL 90*  --  92* 92* 90*  CO2 21  --  27 26 22   GLUCOSE 110*  --  91 108* 181*  BUN 11  --  13 14 15   CREATININE 0.80 0.67 0.70 0.87 0.82  CALCIUM 9.4  --  8.9 9.0 8.2*   Liver Function Tests:  Recent Labs Lab 10/05/2013 2135  AST 57*  ALT 26  ALKPHOS 74  BILITOT 0.4  PROT 7.1  ALBUMIN 3.1*   No results found for this basename: LIPASE, AMYLASE,  in the last 168 hours No results found for this basename: AMMONIA,  in the last 168 hours CBC:  Recent Labs Lab 10/01/2013 2135 09/21/13 0610 09/22/13 0313 09/23/13 0443 09/23/13 1510  WBC 16.8* 13.8* 15.3* 14.6* 15.6*  NEUTROABS 12.3*  --  10.3*  --   --   HGB 12.5 11.6* 11.9* 13.0 11.9*  HCT 36.7 34.0* 34.9* 37.7  35.8*  MCV 96.8 95.2 95.6 95.9 96.8  PLT 432* 365 408* 408* 372   Cardiac Enzymes:  Recent Labs Lab 09/12/2013 2317 09/21/13 0909 09/21/13 1404 09/21/13 2011  TROPONINI <0.30 <0.30 <0.30 <0.30   BNP (last 3 results)  Recent Labs  09/13/2013 2317  PROBNP 818.6*   CBG:  Recent Labs Lab 09/21/13 2053 09/23/13 1437  GLUCAP 101* 198*    Recent Results (from the past 240 hour(s))  URINE CULTURE      Status: None   Collection Time    09/23/2013 11:05 PM      Result Value Range Status   Specimen Description URINE, CLEAN CATCH   Final   Special Requests NONE   Final   Culture  Setup Time     Final   Value: 09/21/2013 06:13     Performed at [place]     Final   Value: 10,000 COLONIES/ML     Performed at [facility]   Culture     Final   Value: PSEUDOMONAS AERUGINOSA     Performed at [facility]   Report Status PENDING   Incomplete  CULTURE, BLOOD (ROUTINE X 2)     Status: None   Collection Time    09/21/13 12:15 AM      Result Value Range Status   Specimen Description BLOOD LEFT ANTECUBITAL   Final   Special Requests     Final   Value: BOTTLES DRAWN AEROBIC AND ANAEROBIC AER 5cc ANA 5cc   Culture  Setup Time     Final   Value: 09/21/2013 02:20     Performed at [facility]   Culture     Final   Value:        BLOOD CULTURE RECEIVED NO GROWTH TO DATE CULTURE WILL BE HELD FOR 5 DAYS BEFORE ISSUING A FINAL NEGATIVE REPORT     Performed at [facility]   Report Status PENDING   Incomplete  CULTURE, BLOOD (ROUTINE X 2)     Status: None   Collection Time    09/21/13 12:15 AM      Result Value Range Status   Specimen Description BLOOD RIGHT ANTECUBITAL   Final   Special Requests     Final   Value: BOTTLES DRAWN AEROBIC AND ANAEROBIC AER 5cc ANA 5cc   Culture  Setup Time     Final   Value: 09/21/2013 02:20     Performed at [facility]   Culture     Final   Value:        BLOOD CULTURE RECEIVED NO GROWTH TO DATE CULTURE WILL BE HELD FOR 5 DAYS BEFORE ISSUING A FINAL NEGATIVE REPORT     Performed at [facility]   Report Status PENDING   Incomplete  CULTURE, BLOOD (ROUTINE X 2)     Status: None   Collection Time    09/21/13  6:10 AM      Result Value Range Status   Specimen Description BLOOD RIGHT ARM   Final   Special Requests BOTTLES DRAWN AEROBIC AND ANAEROBIC 10CC EACH   Final   Culture  Setup Time      Final   Value: 09/21/2013 12:14     Performed at [facility]   Culture     Final   Value:        BLOOD CULTURE RECEIVED NO GROWTH TO DATE CULTURE WILL BE HELD FOR 5 DAYS  BEFORE ISSUING A FINAL NEGATIVE REPORT     Performed at [facility]   Report Status PENDING   Incomplete  CULTURE, BLOOD (ROUTINE X 2)     Status: None   Collection Time    09/21/13  6:18 AM      Result Value Range Status   Specimen Description BLOOD RIGHT ARM   Final   Special Requests BOTTLES DRAWN AEROBIC AND ANAEROBIC 10CC EACH   Final   Culture  Setup Time     Final   Value: 09/21/2013 12:14     Performed at [facility]   Culture     Final   Value:        BLOOD CULTURE RECEIVED NO GROWTH TO DATE CULTURE WILL BE HELD FOR 5 DAYS BEFORE ISSUING A FINAL NEGATIVE REPORT     Performed at [facility]   Report Status PENDING   Incomplete     Studies: Dg Abd 1 View  09/23/2013   CLINICAL DATA:  Nausea, emesis, right-sided abdominal pain  EXAM: ABDOMEN - 1 VIEW  COMPARISON:  05/18/2012  FINDINGS: Mild scoliosis of the lumbar spine with calcification of the aorta. Nonobstructive bowel gas pattern. Mild to moderate fecal retention.  IMPRESSION: Mild to moderate fecal retention particularly in the ascending colon. No acute findings otherwise.   Electronically Signed   By: Skipper Cliche M.D.   On: 09/23/2013 16:13    Scheduled Meds: . aspirin EC  81 mg Oral Daily  . atorvastatin  5 mg Oral Daily  . carvedilol  3.125 mg Oral BID WC  . diazepam  5 mg Oral Daily  . enoxaparin (LOVENOX) injection  40 mg Subcutaneous Daily  . ferrous sulfate  325 mg Oral Q breakfast  . gabapentin  200 mg Oral TID  . levofloxacin  750 mg Oral Q48H  . omega-3 acid ethyl esters  1 g Oral BID  . oseltamivir  30 mg Oral BID  . sodium chloride  3 mL Intravenous Q12H  . venlafaxine XR  150 mg Oral Daily   Continuous Infusions:   Principal Problem:   PNA (pneumonia) Active Problems:   CAD (coronary  artery disease)   Takotsubo cardiomyopathy   PPM-St.Jude   Hypertension   UTI (urinary tract infection)   Weakness generalized   Dehydration   CHF exacerbation    Time spent: 35 mins    [facility] Pager [phone]. If 7PM-7AM, please contact night-coverage at [email or web address], password [facility] 09/23/2013, 9:55 PM  LOS: 3 days

## 2013-09-23 NOTE — Progress Notes (Signed)
UR complete.  Toma Erichsen RN, MSN 

## 2013-09-23 NOTE — Evaluation (Signed)
Physical Therapy Evaluation Patient Details Name: [name] MRN: [id number] DOB: 1927/03/26 Today's Date: 09/23/2013 Time: 3716-9678 PT Time Calculation (min): 22 min  PT Assessment / Plan / Recommendation History of Present Illness  80 yo female with 4 days of cough, nasal congestion, low grade temp , not feeling well, not eating well.  Went to see pcp several days ago diagnosed with a viral syndrome, flu not checked.  Coughing has persisted with more sob.  No temp over 100.  No n/v/d.  Has not eaten or drank hardly anything and just feeling very weak.  No rashes.  No le edema or swelling.  No pnd or orthopnea.  Clinical Impression  Patient demonstrates deficits in functional mobility as indicated below. Will benefit from continued skilled PT to address deficits and maximize function. Recommend discharge with family 24/7 initially. Will see as indiacted and progress as tolerated. SpO2 87 on rm air at rest, 89 on 2 liters at rest 94% on 3 liters     PT Assessment  Patient needs continued PT services    Follow Up Recommendations  No PT follow up;Supervision/Assistance - 24 hour       Barriers to Discharge Decreased caregiver support states she can go stay with her daughter in law    Equipment Recommendations  None recommended by PT       Frequency Min 3X/week    Precautions / Restrictions Precautions Precautions: Other (comment) Precaution Comments: monitor O2   Pertinent Vitals/Pain Sore back; SpO2 87 on rm air at rest, 89 on 2 liters at rest 94% on 3 liters        Mobility  Bed Mobility Overal bed mobility: Modified Independent General bed mobility comments: increased time to perform Transfers Overall transfer level: Modified independent General transfer comment: increased time to perform, no physical assist required at this time Ambulation/Gait Ambulation/Gait assistance: Min guard Ambulation Distance (Feet): 60 Feet Assistive device: None Gait  Pattern/deviations: Step-through pattern;Decreased stride length Gait velocity: decreased Gait velocity interpretation: Below normal speed for age/gender    Exercises     PT Diagnosis:    PT Problem List: Decreased strength;Decreased activity tolerance;Decreased mobility PT Treatment Interventions: Gait training;Stair training;Functional mobility training;Therapeutic activities;Therapeutic exercise;Balance training;Patient/family education     PT Goals(Current goals can be found in the care plan section) Acute Rehab PT Goals Patient Stated Goal: to return home PT Goal Formulation: With patient Time For Goal Achievement: 10/07/13 Potential to Achieve Goals: Good  Visit Information  Last PT Received On: 09/23/13 Assistance Needed: +1 History of Present Illness: 80 yo female with 4 days of cough, nasal congestion, low grade temp , not feeling well, not eating well.  Went to see pcp several days ago diagnosed with a viral syndrome, flu not checked.  Coughing has persisted with more sob.  No temp over 100.  No n/v/d.  Has not eaten or drank hardly anything and just feeling very weak.  No rashes.  No le edema or swelling.  No pnd or orthopnea.       Prior [place] expects to be discharged to:: Private residence Living Arrangements: Alone Available Help at Discharge: Friend(s);Available PRN/intermittently Type of Home: House Home Access: Stairs to enter [employer] of Steps: 2 Entrance Stairs-Rails: Right;Left Home Layout: One level Home Equipment: Bedside commode Additional Comments: Pt states she plans to stay with her daughter in law initially in order to have increased level of assist. Prior Function Level of Independence: Independent Dominant Hand: Right  Cognition  Cognition Arousal/Alertness: Awake/alert Behavior During Therapy: WFL for tasks assessed/performed Overall Cognitive Status: Within Functional Limits for tasks  assessed    Extremity/Trunk Assessment Upper Extremity Assessment Upper Extremity Assessment: Generalized weakness Lower Extremity Assessment Lower Extremity Assessment: Generalized weakness   Balance Balance Overall balance assessment: No apparent balance deficits (not formally assessed)  End of Session PT - End of Session Equipment Utilized During Treatment: Gait belt Activity Tolerance: Patient tolerated treatment well;Patient limited by fatigue Patient left: in chair;with call bell/phone within reach;with family/visitor present Nurse Communication: Mobility status  GP     Duncan Dull 09/23/2013, 10:36 AM Alben Deeds, PT DPT  [phone]

## 2013-09-24 ENCOUNTER — Inpatient Hospital Stay (HOSPITAL_COMMUNITY): Payer: Medicare HMO

## 2013-09-24 LAB — URINE CULTURE

## 2013-09-24 LAB — BASIC METABOLIC PANEL
BUN: 11 mg/dL (ref 6–23)
CO2: 24 mEq/L (ref 19–32)
Calcium: 9.5 mg/dL (ref 8.4–10.5)
Chloride: 95 mEq/L — ABNORMAL LOW (ref 96–112)
Creatinine, Ser: 0.61 mg/dL (ref 0.50–1.10)
GFR calc Af Amer: >90 mL/min (ref >90)
GFR, EST NON AFRICAN AMERICAN: 81 mL/min — AB (ref >90)
GLUCOSE: 116 mg/dL — AB (ref 70–99)
POTASSIUM: 4.5 meq/L (ref 3.7–5.3)
SODIUM: 134 meq/L — AB (ref 137–147)

## 2013-09-24 LAB — CBC
HCT: 39 % (ref 36.0–46.0)
HEMOGLOBIN: 13.2 g/dL (ref 12.0–15.0)
MCH: 32.5 pg (ref 26.0–34.0)
MCHC: 33.8 g/dL (ref 30.0–36.0)
MCV: 96.1 fL (ref 78.0–100.0)
PLATELETS: 446 10*3/uL — AB (ref 150–400)
RBC: 4.06 MIL/uL (ref 3.87–5.11)
RDW: 13.2 % (ref 11.5–15.5)
WBC: 15.8 10*3/uL — AB (ref 4.0–10.5)

## 2013-09-24 MED ORDER — TRAMADOL HCL 50 MG PO TABS
50.0000 mg | ORAL_TABLET | Freq: Four times a day (QID) | ORAL | Status: DC | PRN
  Administered 2013-09-24: 50 mg via ORAL
  Filled 2013-09-24: qty 1

## 2013-09-24 MED ORDER — IOHEXOL 350 MG/ML SOLN
80.0000 mL | Freq: Once | INTRAVENOUS | Status: AC | PRN
  Administered 2013-09-24: 80 mL via INTRAVENOUS

## 2013-09-24 MED ORDER — IBUPROFEN 400 MG PO TABS
400.0000 mg | ORAL_TABLET | Freq: Once | ORAL | Status: AC
  Administered 2013-09-24: 400 mg via ORAL
  Filled 2013-09-24: qty 1

## 2013-09-24 NOTE — Progress Notes (Signed)
[facility] PROGRESS NOTE  [name] ZOX:[id number] DOB: Oct 09, 1926 DOA: 09/11/2013 PCP: Merrilee Seashore, MD  Assessment/Plan: #1. Fever/upper respiratory symptoms Questionable etiology. Concern for pneumonia versus flu like syndrome. Influenza panel is negative. Urine Legionella antigen is negative. Urine pneumococcus antigen is negative. Continue oral Levaquin. Continue empiric Tamiflu. IV fluids. Supportive care.  #2 probable pneumonia Clinical improvement. Urine Legionella and urine pneumococcus antigen is negative. Blood cultures pending.Changed IV azithromycin and IV Rocephin to oral Levaquin to complete a course of therapy.follow.  #3 acute diastolic CHF exacerbation Clinical improvement. I/O = -1.667 mL. Cardiac enzymes negative x3. 2-D echo with EF of 50-55% with no wall motion abnormalities. Patient with some generalized weakness likely secondary to orthostasis. Discontinued IV Lasix. Continue home regimen of Coreg.  #4 leukocytosis Probably secondary to #1 and #2. Blood cultures pending. Urine cultures pending. Continue empiric antibiotics. Follow.  #5 coronary artery disease/ PPM Stable. Discontinued diuretics secondary to generalized weakness and orthostasis..Continue  Coreg.  #6 probable UTI/bacteriuria Urine cultures with 10,000 colonies./ On  antibiotics.  #7 depression Stable. Continue Effexor.  #8 hypoxia Questionable etiology. May be secondary to probable pneumonia. 2-D echo with normal EF and no wall motion abnormalities. Will check a CT angiogram of the chest. Follow.  #9 generalized weakness/orthostasis Improved with hydration and discontinuation of Lasix. Follow  #10 prophylaxis SCDs for DVT prophylaxis.   Code Status: Full Family Communication: updated the patient and daughter at bedside. Disposition Plan: home when medically stable   Consultants:  none  Procedures:  2-D echo 09/21/2013  CT at 10/01/2013   chest x-ray  10/01/2013  Acute abdominal series 09/23/2012  Antibiotics:  IV azithromycin 09/21/2013---> 09/22/2013  IV Rocephin 09/21/2013---> 09/22/2013  Oral Levaquin 09/23/2013  HPI/Subjective: Patient states feeling better earlier this morning.  Objective: Filed Vitals:   09/24/13 1835  BP: 104/54  Pulse: 79  Temp: 97.4 F (36.3 C)  Resp: 18   No intake or output data in the 24 hours ending 09/24/13 1901 Filed Weights   09/17/2013 2117 09/21/13 0220  Weight: 63.504 kg (140 lb) 65.499 kg (144 lb 6.4 oz)    Exam:   General:  NAD  Cardiovascular: RRR  Respiratory: CTAB  Abdomen: Soft/NT/ND/+BS  Musculoskeletal: No c/c/e   Data Reviewed: Basic Metabolic Panel:  Recent Labs Lab 09/25/2013 2135 09/21/13 0610 09/22/13 0313 09/23/13 0443 09/23/13 1510 09/24/13 0421  NA 128*  --  133* 131* 126* 134*  K 4.2  --  3.4* 4.8 3.7 4.5  CL 90*  --  92* 92* 90* 95*  CO2 21  --  27 26 22 24   GLUCOSE 110*  --  91 108* 181* 116*  BUN 11  --  13 14 15 11   CREATININE 0.80 0.67 0.70 0.87 0.82 0.61  CALCIUM 9.4  --  8.9 9.0 8.2* 9.5   Liver Function Tests:  Recent Labs Lab 09/22/2013 2135  AST 57*  ALT 26  ALKPHOS 74  BILITOT 0.4  PROT 7.1  ALBUMIN 3.1*   No results found for this basename: LIPASE, AMYLASE,  in the last 168 hours No results found for this basename: AMMONIA,  in the last 168 hours CBC:  Recent Labs Lab 09/30/2013 2135 09/21/13 0610 09/22/13 0313 09/23/13 0443 09/23/13 1510 09/24/13 0421  WBC 16.8* 13.8* 15.3* 14.6* 15.6* 15.8*  NEUTROABS 12.3*  --  10.3*  --   --   --   HGB 12.5 11.6* 11.9* 13.0 11.9* 13.2  HCT 36.7 34.0* 34.9* 37.7 35.8* 39.0  MCV 96.8 95.2 95.6 95.9 96.8 96.1  PLT 432* 365 408* 408* 372 446*   Cardiac Enzymes:  Recent Labs Lab 10/03/2013 2317 09/21/13 0909 09/21/13 1404 09/21/13 2011  TROPONINI <0.30 <0.30 <0.30 <0.30   BNP (last 3 results)  Recent Labs  09/12/2013 2317  PROBNP 818.6*   CBG:  Recent Labs Lab  09/21/13 2053 09/23/13 1437  GLUCAP 101* 198*    Recent Results (from the past 240 hour(s))  URINE CULTURE     Status: None   Collection Time    09/10/2013 11:05 PM      Result Value Range Status   Specimen Description URINE, CLEAN CATCH   Final   Special Requests NONE   Final   Culture  Setup Time     Final   Value: 09/21/2013 06:13     Performed at [place]     Final   Value: 10,000 COLONIES/ML     Performed at [facility]   Culture     Final   Value: PSEUDOMONAS AERUGINOSA     Performed at [facility]   Report Status 09/24/2013 FINAL   Final   Organism ID, Bacteria PSEUDOMONAS AERUGINOSA   Final  CULTURE, BLOOD (ROUTINE X 2)     Status: None   Collection Time    09/21/13 12:15 AM      Result Value Range Status   Specimen Description BLOOD LEFT ANTECUBITAL   Final   Special Requests     Final   Value: BOTTLES DRAWN AEROBIC AND ANAEROBIC AER 5cc ANA 5cc   Culture  Setup Time     Final   Value: 09/21/2013 02:20     Performed at [facility]   Culture     Final   Value:        BLOOD CULTURE RECEIVED NO GROWTH TO DATE CULTURE WILL BE HELD FOR 5 DAYS BEFORE ISSUING A FINAL NEGATIVE REPORT     Performed at [facility]   Report Status PENDING   Incomplete  CULTURE, BLOOD (ROUTINE X 2)     Status: None   Collection Time    09/21/13 12:15 AM      Result Value Range Status   Specimen Description BLOOD RIGHT ANTECUBITAL   Final   Special Requests     Final   Value: BOTTLES DRAWN AEROBIC AND ANAEROBIC AER 5cc ANA 5cc   Culture  Setup Time     Final   Value: 09/21/2013 02:20     Performed at [facility]   Culture     Final   Value:        BLOOD CULTURE RECEIVED NO GROWTH TO DATE CULTURE WILL BE HELD FOR 5 DAYS BEFORE ISSUING A FINAL NEGATIVE REPORT     Performed at [facility]   Report Status PENDING   Incomplete  CULTURE, BLOOD (ROUTINE X 2)     Status: None   Collection Time    09/21/13  6:10 AM       Result Value Range Status   Specimen Description BLOOD RIGHT ARM   Final   Special Requests BOTTLES DRAWN AEROBIC AND ANAEROBIC 10CC EACH   Final   Culture  Setup Time     Final   Value: 09/21/2013 12:14     Performed at [facility]   Culture     Final   Value:        BLOOD CULTURE RECEIVED  NO GROWTH TO DATE CULTURE WILL BE HELD FOR 5 DAYS BEFORE ISSUING A FINAL NEGATIVE REPORT     Performed at [facility]   Report Status PENDING   Incomplete  CULTURE, BLOOD (ROUTINE X 2)     Status: None   Collection Time    09/21/13  6:18 AM      Result Value Range Status   Specimen Description BLOOD RIGHT ARM   Final   Special Requests BOTTLES DRAWN AEROBIC AND ANAEROBIC 10CC EACH   Final   Culture  Setup Time     Final   Value: 09/21/2013 12:14     Performed at [facility]   Culture     Final   Value:        BLOOD CULTURE RECEIVED NO GROWTH TO DATE CULTURE WILL BE HELD FOR 5 DAYS BEFORE ISSUING A FINAL NEGATIVE REPORT     Performed at [facility]   Report Status PENDING   Incomplete     Studies: Dg Abd 1 View  09/23/2013   CLINICAL DATA:  Nausea, emesis, right-sided abdominal pain  EXAM: ABDOMEN - 1 VIEW  COMPARISON:  05/18/2012  FINDINGS: Mild scoliosis of the lumbar spine with calcification of the aorta. Nonobstructive bowel gas pattern. Mild to moderate fecal retention.  IMPRESSION: Mild to moderate fecal retention particularly in the ascending colon. No acute findings otherwise.   Electronically Signed   By: Skipper Cliche M.D.   On: 09/23/2013 16:13   Ct Angio Chest Pe W/cm &/or Wo Cm  09/24/2013   CLINICAL DATA:  hypoxia  EXAM: CT ANGIOGRAPHY CHEST WITH CONTRAST  TECHNIQUE: Multidetector CT imaging of the chest was performed using the standard protocol during bolus administration of intravenous contrast. Multiplanar CT image reconstructions including MIPs were obtained to evaluate the vascular anatomy.  CONTRAST:  46mL OMNIPAQUE IOHEXOL 350 MG/ML SOLN   COMPARISON:  DG CHEST 2 VIEW dated 09/08/2013; CT ABD-PELV WO/W CM (HEMATURIA) dated 09/06/2010; DG CHEST 2 VIEW dated 09/26/2012  FINDINGS: There are no filling defects within the pulmonary arteries to suggest acute pulmonary embolism. No acute findings of the aorta or great vessels. There is however extensive perihilar bronchovascular soft tissue thickening extending from the hilum into the left or right lower lobes surrounding the bronchi (image 71 and 64 for example). Bilateral pleural effusions. There is consolidation and atelectasis in the left and right lower lobe extending from bronchovascular thickening.  There is an enlarged subcarinal lymph node measuring 2.4 cm. There is a large prevascular lymph node measuring 16 mm. Within the left upper lobe there is a rounded 2.5 x 17 mm nodule (image 56, series 6). There is mild nodularity interlobular septal thickening in the upper lobes  No axillary or supraclavicular adenopathy.  Limited view of the upper abdomen is unremarkable. Limited view of the skeleton is unremarkable.  Review of the MIP images confirms the above findings.  IMPRESSION: 1. No evidence acute pulmonary embolism. 2. Extensive peribronchovascular soft tissue thickening extending from the left and right hilum into the lower lobes. There is consolidation peripheral to this bronchovascular thickening. Differential includes lymphangitic carcinoma, versus a inflammatory infectious process. 3. Left upper lobe pulmonary nodule and mediastinal lymphadenopathy is concerning for a neoplastic process. 4. Lower lobe bronchoscopy may be valuable for determine etiology. 5. Consolidation in the lower lobes could suggests superimposed infectious process. 6. Patient may ultimately benefit from an outpatient FDG PET scan.   Electronically Signed   By: Helane Gunther.D.  On: 09/24/2013 17:56    Scheduled Meds: . aspirin EC  81 mg Oral Daily  . atorvastatin  5 mg Oral Daily  . carvedilol  3.125 mg Oral  BID WC  . diazepam  5 mg Oral Daily  . enoxaparin (LOVENOX) injection  40 mg Subcutaneous Daily  . ferrous sulfate  325 mg Oral Q breakfast  . gabapentin  200 mg Oral TID  . levofloxacin  750 mg Oral Q48H  . omega-3 acid ethyl esters  1 g Oral BID  . oseltamivir  30 mg Oral BID  . polyethylene glycol  17 g Oral BID  . sodium chloride  3 mL Intravenous Q12H  . venlafaxine XR  150 mg Oral Daily   Continuous Infusions: . sodium chloride 950 mL (09/24/13 1240)    Principal Problem:   PNA (pneumonia) Active Problems:   CAD (coronary artery disease)   Takotsubo cardiomyopathy   PPM-St.Jude   Hypertension   UTI (urinary tract infection)   Weakness generalized   Dehydration   CHF exacerbation    Time spent: 66 mins    [facility] MD [facility] Pager [phone]. If 7PM-7AM, please contact night-coverage at [email or web address], password [facility] 09/24/2013, 7:01 PM  LOS: 4 days

## 2013-09-24 NOTE — Progress Notes (Signed)
Physical Therapy Treatment Patient Details Name: [name] MRN: [id number] DOB: 1927/03/21 Today's Date: 09/24/2013 Time: 1517-6160 PT Time Calculation (min): 15 min  PT Assessment / Plan / Recommendation  History of Present Illness 80 yo female with 4 days of cough, nasal congestion, low grade temp , not feeling well, not eating well.  Went to see pcp several days ago diagnosed with a viral syndrome, flu not checked.  Coughing has persisted with more sob.  No temp over 100.  No n/v/d.  Has not eaten or drank hardly anything and just feeling very weak.  No rashes.  No le edema or swelling.  No pnd or orthopnea.   PT Comments   Patient c/o feeling sleepy and dizzy from pain meds for headache.  Did work on ambulation, but limited distance.    Follow Up Recommendations  No PT follow up;Supervision/Assistance - 24 hour     Does the patient have the potential to tolerate intense rehabilitation     Barriers to Discharge        Equipment Recommendations  None recommended by PT    Recommendations for Other Services    Frequency Min 3X/week   Progress towards PT Goals Progress towards PT goals: Not progressing toward goals - comment (Due to fatigue/dizziness)  Plan Current plan remains appropriate    Precautions / Restrictions Precautions Precautions: Fall Restrictions Weight Bearing Restrictions: No   Pertinent Vitals/Pain     Mobility  Bed Mobility Overal bed mobility: Modified Independent General bed mobility comments: increased time to perform Transfers Overall transfer level: Modified independent Equipment used: None General transfer comment: increased time to perform, no physical assist required at this time Ambulation/Gait Ambulation/Gait assistance: Min guard Ambulation Distance (Feet): 40 Feet Assistive device: None Gait Pattern/deviations: Step-through pattern;Decreased step length - right;Decreased step length - left Gait velocity: decreased General Gait  Details: Patient reports feeling dizzy and sleepy due to pain meds for headache.  Reduced distance for gait today.  Slight unsteadiness noted.      PT Goals (current goals can now be found in the care plan section)    Visit Information  Last PT Received On: 09/24/13 Assistance Needed: +1 History of Present Illness: 80 yo female with 4 days of cough, nasal congestion, low grade temp , not feeling well, not eating well.  Went to see pcp several days ago diagnosed with a viral syndrome, flu not checked.  Coughing has persisted with more sob.  No temp over 100.  No n/v/d.  Has not eaten or drank hardly anything and just feeling very weak.  No rashes.  No le edema or swelling.  No pnd or orthopnea.    Subjective Data  Subjective: I'm feeling dizzy today because of the pain medicine for my headache.   Cognition  Cognition Arousal/Alertness: Awake/alert ("sleepy") Behavior During Therapy: WFL for tasks assessed/performed Overall Cognitive Status: Within Functional Limits for tasks assessed    Balance     End of Session PT - End of Session Equipment Utilized During Treatment: Gait belt Activity Tolerance: Patient limited by fatigue Patient left: in chair;with call bell/phone within reach;with nursing/sitter in room Nurse Communication: Mobility status   GP     Despina Pole 09/24/2013, 2:15 PM Carita Pian. Sanjuana Kava, [place] Pager [phone]

## 2013-09-25 ENCOUNTER — Inpatient Hospital Stay (HOSPITAL_COMMUNITY): Payer: Medicare HMO

## 2013-09-25 DIAGNOSIS — J9600 Acute respiratory failure, unspecified whether with hypoxia or hypercapnia: Secondary | ICD-10-CM

## 2013-09-25 DIAGNOSIS — J471 Bronchiectasis with (acute) exacerbation: Secondary | ICD-10-CM | POA: Diagnosis present

## 2013-09-25 DIAGNOSIS — R0603 Acute respiratory distress: Secondary | ICD-10-CM | POA: Diagnosis present

## 2013-09-25 LAB — CBC
HEMATOCRIT: 38 % (ref 36.0–46.0)
Hemoglobin: 13.1 g/dL (ref 12.0–15.0)
MCH: 32.3 pg (ref 26.0–34.0)
MCHC: 34.5 g/dL (ref 30.0–36.0)
MCV: 93.8 fL (ref 78.0–100.0)
Platelets: 415 10*3/uL — ABNORMAL HIGH (ref 150–400)
RBC: 4.05 MIL/uL (ref 3.87–5.11)
RDW: 13.2 % (ref 11.5–15.5)
WBC: 17 10*3/uL — AB (ref 4.0–10.5)

## 2013-09-25 LAB — COMPREHENSIVE METABOLIC PANEL
ALBUMIN: 2.3 g/dL — AB (ref 3.5–5.2)
ALT: 37 U/L — ABNORMAL HIGH (ref 0–35)
AST: 56 U/L — ABNORMAL HIGH (ref 0–37)
Alkaline Phosphatase: 78 U/L (ref 39–117)
BILIRUBIN TOTAL: 0.4 mg/dL (ref 0.3–1.2)
BUN: 6 mg/dL (ref 6–23)
CALCIUM: 8.9 mg/dL (ref 8.4–10.5)
CHLORIDE: 89 meq/L — AB (ref 96–112)
CO2: 22 mEq/L (ref 19–32)
CREATININE: 0.57 mg/dL (ref 0.50–1.10)
GFR calc Af Amer: >90 mL/min (ref >90)
GFR calc non Af Amer: 83 mL/min — ABNORMAL LOW (ref >90)
Glucose, Bld: 137 mg/dL — ABNORMAL HIGH (ref 70–99)
Potassium: 4.3 mEq/L (ref 3.7–5.3)
Sodium: 127 mEq/L — ABNORMAL LOW (ref 137–147)
Total Protein: 6.4 g/dL (ref 6.0–8.3)

## 2013-09-25 LAB — GLUCOSE, CAPILLARY
GLUCOSE-CAPILLARY: 186 mg/dL — AB (ref 70–99)
GLUCOSE-CAPILLARY: 157 mg/dL — AB (ref 70–99)
GLUCOSE-CAPILLARY: 130 mg/dL — AB (ref 70–99)

## 2013-09-25 LAB — BLOOD GAS, ARTERIAL
ACID-BASE EXCESS: 0.5 mmol/L (ref 0.0–2.0)
Acid-Base Excess: 0.7 mmol/L (ref 0.0–2.0)
BICARBONATE: 23.9 meq/L (ref 20.0–24.0)
Bicarbonate: 23.7 mEq/L (ref 20.0–24.0)
Delivery systems: POSITIVE
Drawn by: 246861
EXPIRATORY PAP: 8
FIO2: 1 %
FIO2: 1 %
Inspiratory PAP: 12
O2 SAT: 97.4 %
O2 Saturation: 92.6 %
PCO2 ART: 35.8 mmHg (ref 35.0–45.0)
PH ART: 7.448 (ref 7.350–7.450)
PO2 ART: 71.4 mmHg — AB (ref 80.0–100.0)
Patient temperature: 98.6
Patient temperature: 102
TCO2: 24.6 mmol/L (ref 0–100)
TCO2: 24.9 mmol/L (ref 0–100)
pCO2 arterial: 31.9 mmHg — ABNORMAL LOW (ref 35.0–45.0)
pH, Arterial: 7.483 — ABNORMAL HIGH (ref 7.350–7.450)
pO2, Arterial: 85.7 mmHg (ref 80.0–100.0)

## 2013-09-25 LAB — BASIC METABOLIC PANEL
BUN: 7 mg/dL (ref 6–23)
CHLORIDE: 91 meq/L — AB (ref 96–112)
CO2: 23 meq/L (ref 19–32)
Calcium: 9.1 mg/dL (ref 8.4–10.5)
Creatinine, Ser: 0.61 mg/dL (ref 0.50–1.10)
GFR calc Af Amer: >90 mL/min (ref >90)
GFR calc non Af Amer: 81 mL/min — ABNORMAL LOW (ref >90)
Glucose, Bld: 179 mg/dL — ABNORMAL HIGH (ref 70–99)
Potassium: 4 mEq/L (ref 3.7–5.3)
SODIUM: 128 meq/L — AB (ref 137–147)

## 2013-09-25 LAB — MRSA PCR SCREENING: MRSA by PCR: NEGATIVE

## 2013-09-25 LAB — PRO B NATRIURETIC PEPTIDE: PRO B NATRI PEPTIDE: 1223 pg/mL — AB (ref 0–450)

## 2013-09-25 MED ORDER — VANCOMYCIN HCL 500 MG IV SOLR
500.0000 mg | Freq: Two times a day (BID) | INTRAVENOUS | Status: DC
  Administered 2013-09-25: 500 mg via INTRAVENOUS
  Filled 2013-09-25 (×3): qty 500

## 2013-09-25 MED ORDER — FUROSEMIDE 10 MG/ML IJ SOLN: 40.0000 mg | Freq: Two times a day (BID) | INTRAMUSCULAR | Status: DC

## 2013-09-25 MED ORDER — FUROSEMIDE 10 MG/ML IJ SOLN
40.0000 mg | Freq: Once | INTRAMUSCULAR | Status: AC
  Administered 2013-09-25: 40 mg via INTRAVENOUS

## 2013-09-25 MED ORDER — PIPERACILLIN-TAZOBACTAM 3.375 G IVPB
3.3750 g | Freq: Three times a day (TID) | INTRAVENOUS | Status: DC
  Administered 2013-09-26 – 2013-10-03 (×22): 3.375 g via INTRAVENOUS
  Filled 2013-09-25 (×26): qty 50

## 2013-09-25 MED ORDER — LORAZEPAM 2 MG/ML IJ SOLN
INTRAMUSCULAR | Status: AC
  Administered 2013-09-26: 2 mg
  Filled 2013-09-25: qty 1

## 2013-09-25 MED ORDER — DOPAMINE-DEXTROSE 3.2-5 MG/ML-% IV SOLN
INTRAVENOUS | Status: AC
  Filled 2013-09-25: qty 250

## 2013-09-25 MED ORDER — NOREPINEPHRINE BITARTRATE 1 MG/ML IJ SOLN
2.0000 ug/min | INTRAVENOUS | Status: DC
  Administered 2013-09-25: 55 ug/min via INTRAVENOUS
  Administered 2013-09-26: 16 ug/min via INTRAVENOUS
  Administered 2013-09-26: 13 ug/min via INTRAVENOUS
  Administered 2013-09-26: 25 ug/min via INTRAVENOUS
  Administered 2013-09-26: 10 ug/min via INTRAVENOUS
  Filled 2013-09-25 (×4): qty 16

## 2013-09-25 MED ORDER — SODIUM CHLORIDE 0.9 % IV SOLN
0.0000 ug/h | INTRAVENOUS | Status: DC
  Administered 2013-09-25: 200 ug/h via INTRAVENOUS
  Administered 2013-09-26: 25 ug/h via INTRAVENOUS
  Administered 2013-09-27: 50 ug/h via INTRAVENOUS
  Administered 2013-09-28: 100 ug/h via INTRAVENOUS
  Administered 2013-09-29: 150 ug/h via INTRAVENOUS
  Administered 2013-09-29: 50 ug/h via INTRAVENOUS
  Administered 2013-09-30: 200 ug/h via INTRAVENOUS
  Administered 2013-09-30: 100 ug/h via INTRAVENOUS
  Administered 2013-10-01: 200 ug/h via INTRAVENOUS
  Administered 2013-10-02: 100 ug/h via INTRAVENOUS
  Administered 2013-10-02: 200 ug/h via INTRAVENOUS
  Administered 2013-10-03: 250 ug/h via INTRAVENOUS
  Administered 2013-10-03: 200 ug/h via INTRAVENOUS
  Administered 2013-10-04: 250 ug/h via INTRAVENOUS
  Administered 2013-10-04: 50 ug/h via INTRAVENOUS
  Administered 2013-10-04: 300 ug/h via INTRAVENOUS
  Filled 2013-09-25 (×13): qty 50

## 2013-09-25 MED ORDER — FUROSEMIDE 10 MG/ML IJ SOLN
INTRAMUSCULAR | Status: AC
  Filled 2013-09-25: qty 4

## 2013-09-25 MED ORDER — FENTANYL BOLUS VIA INFUSION
25.0000 ug | INTRAVENOUS | Status: DC | PRN
  Filled 2013-09-25: qty 50

## 2013-09-25 NOTE — Evaluation (Addendum)
Called to bedside to evaluate [name].  80 years old female with PMH relevant for HTN, CAD, takotsubo cardiomyopathy with recent echo with LVEF of 55%. Presented initially with cough, fever and malaise. She is flu negative. CTA negative for PE but showed bilateral pleural effusions, bilateral consolidations and a LUL nodule as well as mediastinal adenopathy.  Now transferred to stepdown unit with worsening hypoxemia requiring 100% NRB. Her last ABG showed pH7.48, pCO2: 31, PaO2: 85. CBC shows worsenig WBC 15.8 -> 17 On exam, awake, alert, mild to moderate respiratory distress. Normotensive, tachycardic, saturating 91% on 100% NRB.   Recommendations: 1) Agree with BiPAP 12/8 2) Follow up ABG in 1 hour 3) Will d/c levaquin and start vanc / Zosyn 4) Continue diuresis with lasix 5) Will benefit from bronchoscopy with BAL but to hypoxic and high risk for intubation for now.  6) We will continue to follow closely  Waynetta Pean, M.D. Pulmonary and [place] Pager: [phone]  Addendum: Patient with persistent hypoxemia despite BiPAP and 100% oxygen.  Now also hypotensive. Will transfer to the ICU for intubation and mechanical ventilation and possible vasopressors.   Waynetta Pean, M.D. Pulmonary and [place] [facility] Pager: [phone]

## 2013-09-25 NOTE — Significant Event (Signed)
Around 1700 went to assess patient and give PM meds. Checked vitals, 02 sat was 80-82% O2 [place]. Bumped O2 to 10L and sats only rised to 85%. Placed patient on Nonrebreather mask and O2 sats finally increased to 89-91%. Heart rate was sinus tach 104-117 bpm.  Patient was lethargic, but A/O, skin warm, states "not feeling well." Paged Dr Grandville Silos who came to room to assess patient. Ordered stat CXR, labs, ABG, and lasix IV 40mg . RN gave IV lasix and placed foley catheter for aggressive IV diuresis. Received order to transfer patient to step down 2C12. Report given to Abla on 2C.  Transferred patient on nonrebreather with RN at bedside.

## 2013-09-25 NOTE — Progress Notes (Signed)
Was called per nursing that patient in respiratory distress with sats of 80% on 3 L. Nasal cannula was increased to 10 L with O2 sats in the high 80s. Patient subsequently placed on a nonrebreather. Came and assessed patient patient stated she's not feeling well. Patient somewhat lethargic, opens eyes and answer some questions. General: Lethargic on nonrebreather answering questions. Respiratory: Diffuse crackles. Decreased breath sounds in the bases. Some use of accessory muscles of respiration. Cardiovascular: Regular rate and rhythm Abdomen: Soft, nontender, nondistended, positive bowel sounds. Extremities: No clubbing or cyanosis or edema.   Assessment/plan  #1 acute respiratory distress Questionable etiology. Some concern for volume overload. Patient being treated for pneumonia with abnormal CT scan that was evaluated by pulmonary early on today. We'll transfer patient to the step down unit. Check a stat ABG. Check a stat chest x-ray. Check a pro BNP. Check a comprehensive metabolic profile. Check a CBC. Continue nonrebreather for now. BiPAP as needed. Will give a dose of Lasix 40 mg IV x1. Continue current antibiotics. Will consult with critical care medicine for further evaluation and management.

## 2013-09-25 NOTE — Plan of Care (Signed)
Problem: Phase II Progression Outcomes Goal: Progress activity as tolerated unless otherwise ordered Outcome: Not Progressing Patient continues to have increased fatigue and somnolence. Patient is arousable but needs strong encouragement to engage in any activity.

## 2013-09-25 NOTE — Progress Notes (Signed)
[facility] PRO[facility]ESS [facility]  [name] FMB:[id number] DOB: Nov 08, 1926 DOA: 09/25/2013 PCP: Merrilee Seashore, MD  Assessment/Plan: #1. Fever/upper respiratory symptoms/Prob CAP Questionable etiology. Concern for pneumonia versus flu like syndrome. Influenza panel is negative. Urine Legionella antigen is negative. Urine pneumococcus antigen is negative. Continue oral Levaquin. Continue empiric Tamiflu. IV fluids. Supportive care.  #2 probable CAP/pneumonia Clinical improvement. Urine Legionella and urine pneumococcus antigen is negative. Blood cultures pending.Changed IV azithromycin and IV Rocephin to oral Levaquin to complete a course of therapy.follow.  #3 acute diastolic CHF exacerbation Clinical improvement. I/O = -1.667 mL. Cardiac enzymes negative x3. 2-D echo with EF of 50-55% with no wall motion abnormalities. Patient with some generalized weakness likely secondary to orthostasis. Discontinued IV Lasix. Continue home regimen of Coreg.  #4 leukocytosis Probably secondary to #1 and #2. Blood cultures pending. Continue empiric antibiotics. Follow.  #5 coronary artery disease/ PPM Stable. Discontinued diuretics secondary to generalized weakness and orthostasis..Continue  Coreg.  #6 probable UTI/bacteriuria Urine cultures with 10,000 colonies./ On  antibiotics.  #7 depression Stable. Continue Effexor.  #8 hypoxia/Abnormal CT chest Questionable etiology. May be secondary to probable pneumonia. 2-D echo with normal EF and no wall motion abnormalities. CT angiogram of the chest with pulmonary nodule, lymphadenopathy, consolidation in the lower lobes and extensive. Bronchovascular soft tissue thickening extending from left to right him into the lower lobes. Continue empiric antibiotics. We'll consult with pulmonary for further evaluation and management.  #9 generalized weakness/orthostasis Improved with hydration and discontinuation of Lasix. Follow  #10  hyponatremia Somewhat chronic. Check a urine sodium. Check a urine creatinine. Check a serum osmolality. Check a urine osmolality. Abnormal CT chest. Normal saline lock IV fluids. Follow.  #11 prophylaxis SCDs for DVT prophylaxis.   Code Status: Full Family Communication: updated the patient and daughter at bedside. Disposition Plan: home when medically stable   Consultants:  none  Procedures:  2-D echo 09/21/2013  CT at 09/17/2013   chest x-ray 09/29/2013  Acute abdominal series 09/23/2013  CT chest 09/24/13  Antibiotics:  IV azithromycin 09/21/2013---> 09/22/2013  IV Rocephin 09/21/2013---> 09/22/2013  Oral Levaquin 09/23/2013  HPI/Subjective: Patient states feeling better.  Objective: Filed Vitals:   09/25/13 0620  BP: 136/63  Pulse: 91  Temp: 97.9 F (36.6 C)  Resp: 20    Intake/Output Summary (Last 24 hours) at 09/25/13 1031 Last data filed at 09/25/13 0600  Gross per 24 hour  Intake      0 ml  Output      0 ml  Net      0 ml   Filed Weights   09/17/2013 2117 09/21/13 0220  Weight: 63.504 kg (140 lb) 65.499 kg (144 lb 6.4 oz)    Exam:   General:  NAD  Cardiovascular: RRR  Respiratory: CTAB. Decreased BS in bases.  Abdomen: Soft/NT/ND/+BS  Musculoskeletal: No c/c/e   Data Reviewed: Basic Metabolic Panel:  Recent Labs Lab 09/22/13 0313 09/23/13 0443 09/23/13 1510 09/24/13 0421 09/25/13 0536  NA 133* 131* 126* 134* 128*  K 3.4* 4.8 3.7 4.5 4.0  CL 92* 92* 90* 95* 91*  CO2 27 26 22 24 23   GLUCOSE 91 108* 181* 116* 179*  BUN 13 14 15 11 7   CREATININE 0.70 0.87 0.82 0.61 0.61  CALCIUM 8.9 9.0 8.2* 9.5 9.1   Liver Function Tests:  Recent Labs Lab 10/05/2013 2135  AST 57*  ALT 26  ALKPHOS 74  BILITOT 0.4  PROT 7.1  ALBUMIN 3.1*   No results found for  this basename: LIPASE, AMYLASE,  in the last 168 hours No results found for this basename: AMMONIA,  in the last 168 hours CBC:  Recent Labs Lab 09/22/2013 2135  09/21/13 0610 09/22/13 0313 09/23/13 0443 09/23/13 1510 09/24/13 0421  WBC 16.8* 13.8* 15.3* 14.6* 15.6* 15.8*  NEUTROABS 12.3*  --  10.3*  --   --   --   HGB 12.5 11.6* 11.9* 13.0 11.9* 13.2  HCT 36.7 34.0* 34.9* 37.7 35.8* 39.0  MCV 96.8 95.2 95.6 95.9 96.8 96.1  PLT 432* 365 408* 408* 372 446*   Cardiac Enzymes:  Recent Labs Lab 09/25/2013 2317 09/21/13 0909 09/21/13 1404 09/21/13 2011  TROPONINI <0.30 <0.30 <0.30 <0.30   BNP (last 3 results)  Recent Labs  09/14/2013 2317  PROBNP 818.6*   CBG:  Recent Labs Lab 09/21/13 2053 09/23/13 1437  GLUCAP 101* 198*    Recent Results (from the past 240 hour(s))  URINE CULTURE     Status: None   Collection Time    09/24/2013 11:05 PM      Result Value Range Status   Specimen Description URINE, CLEAN CATCH   Final   Special Requests NONE   Final   Culture  Setup Time     Final   Value: 09/21/2013 06:13     Performed at [place]     Final   Value: 10,000 COLONIES/ML     Performed at [facility]   Culture     Final   Value: PSEUDOMONAS AERUGINOSA     Performed at [facility]   Report Status 09/24/2013 FINAL   Final   Organism ID, Bacteria PSEUDOMONAS AERUGINOSA   Final  CULTURE, BLOOD (ROUTINE X 2)     Status: None   Collection Time    09/21/13 12:15 AM      Result Value Range Status   Specimen Description BLOOD LEFT ANTECUBITAL   Final   Special Requests     Final   Value: BOTTLES DRAWN AEROBIC AND ANAEROBIC AER 5cc ANA 5cc   Culture  Setup Time     Final   Value: 09/21/2013 02:20     Performed at [facility]   Culture     Final   Value:        BLOOD CULTURE RECEIVED NO GROWTH TO DATE CULTURE WILL BE HELD FOR 5 DAYS BEFORE ISSUING A FINAL NEGATIVE REPORT     Performed at [facility]   Report Status PENDING   Incomplete  CULTURE, BLOOD (ROUTINE X 2)     Status: None   Collection Time    09/21/13 12:15 AM      Result Value Range Status   Specimen  Description BLOOD RIGHT ANTECUBITAL   Final   Special Requests     Final   Value: BOTTLES DRAWN AEROBIC AND ANAEROBIC AER 5cc ANA 5cc   Culture  Setup Time     Final   Value: 09/21/2013 02:20     Performed at [facility]   Culture     Final   Value:        BLOOD CULTURE RECEIVED NO GROWTH TO DATE CULTURE WILL BE HELD FOR 5 DAYS BEFORE ISSUING A FINAL NEGATIVE REPORT     Performed at [facility]   Report Status PENDING   Incomplete  CULTURE, BLOOD (ROUTINE X 2)     Status: None   Collection Time    09/21/13  6:10 AM      Result Value Range Status   Specimen Description BLOOD RIGHT ARM   Final   Special Requests BOTTLES DRAWN AEROBIC AND ANAEROBIC 10CC EACH   Final   Culture  Setup Time     Final   Value: 09/21/2013 12:14     Performed at [facility]   Culture     Final   Value:        BLOOD CULTURE RECEIVED NO GROWTH TO DATE CULTURE WILL BE HELD FOR 5 DAYS BEFORE ISSUING A FINAL NEGATIVE REPORT     Performed at [facility]   Report Status PENDING   Incomplete  CULTURE, BLOOD (ROUTINE X 2)     Status: None   Collection Time    09/21/13  6:18 AM      Result Value Range Status   Specimen Description BLOOD RIGHT ARM   Final   Special Requests BOTTLES DRAWN AEROBIC AND ANAEROBIC 10CC EACH   Final   Culture  Setup Time     Final   Value: 09/21/2013 12:14     Performed at [facility]   Culture     Final   Value:        BLOOD CULTURE RECEIVED NO GROWTH TO DATE CULTURE WILL BE HELD FOR 5 DAYS BEFORE ISSUING A FINAL NEGATIVE REPORT     Performed at [facility]   Report Status PENDING   Incomplete     Studies: Dg Abd 1 View  09/23/2013   CLINICAL DATA:  Nausea, emesis, right-sided abdominal pain  EXAM: ABDOMEN - 1 VIEW  COMPARISON:  05/18/2012  FINDINGS: Mild scoliosis of the lumbar spine with calcification of the aorta. Nonobstructive bowel gas pattern. Mild to moderate fecal retention.  IMPRESSION: Mild to moderate fecal retention  particularly in the ascending colon. No acute findings otherwise.   Electronically Signed   By: Skipper Cliche M.D.   On: 09/23/2013 16:13   Ct Angio Chest Pe W/cm &/or Wo Cm  09/24/2013   CLINICAL DATA:  hypoxia  EXAM: CT ANGIOGRAPHY CHEST WITH CONTRAST  TECHNIQUE: Multidetector CT imaging of the chest was performed using the standard protocol during bolus administration of intravenous contrast. Multiplanar CT image reconstructions including MIPs were obtained to evaluate the vascular anatomy.  CONTRAST:  79mL OMNIPAQUE IOHEXOL 350 MG/ML SOLN  COMPARISON:  DG CHEST 2 VIEW dated 09/22/2013; CT ABD-PELV WO/W CM (HEMATURIA) dated 09/06/2010; DG CHEST 2 VIEW dated 09/26/2012  FINDINGS: There are no filling defects within the pulmonary arteries to suggest acute pulmonary embolism. No acute findings of the aorta or great vessels. There is however extensive perihilar bronchovascular soft tissue thickening extending from the hilum into the left or right lower lobes surrounding the bronchi (image 71 and 64 for example). Bilateral pleural effusions. There is consolidation and atelectasis in the left and right lower lobe extending from bronchovascular thickening.  There is an enlarged subcarinal lymph node measuring 2.4 cm. There is a large prevascular lymph node measuring 16 mm. Within the left upper lobe there is a rounded 2.5 x 17 mm nodule (image 56, series 6). There is mild nodularity interlobular septal thickening in the upper lobes  No axillary or supraclavicular adenopathy.  Limited view of the upper abdomen is unremarkable. Limited view of the skeleton is unremarkable.  Review of the MIP images confirms the above findings.  IMPRESSION: 1. No evidence acute pulmonary embolism. 2. Extensive peribronchovascular soft tissue thickening extending from the left and  right hilum into the lower lobes. There is consolidation peripheral to this bronchovascular thickening. Differential includes lymphangitic carcinoma, versus a  inflammatory infectious process. 3. Left upper lobe pulmonary nodule and mediastinal lymphadenopathy is concerning for a neoplastic process. 4. Lower lobe bronchoscopy may be valuable for determine etiology. 5. Consolidation in the lower lobes could suggests superimposed infectious process. 6. Patient may ultimately benefit from an outpatient FDG PET scan.   Electronically Signed   By: Suzy Bouchard M.D.   On: 09/24/2013 17:56    Scheduled Meds: . aspirin EC  81 mg Oral Daily  . atorvastatin  5 mg Oral Daily  . carvedilol  3.125 mg Oral BID WC  . diazepam  5 mg Oral Daily  . enoxaparin (LOVENOX) injection  40 mg Subcutaneous Daily  . ferrous sulfate  325 mg Oral Q breakfast  . gabapentin  200 mg Oral TID  . levofloxacin  750 mg Oral Q48H  . omega-3 acid ethyl esters  1 g Oral BID  . oseltamivir  30 mg Oral BID  . polyethylene glycol  17 g Oral BID  . venlafaxine XR  150 mg Oral Daily   Continuous Infusions:    Principal Problem:   PNA (pneumonia) Active Problems:   CAD (coronary artery disease)   Takotsubo cardiomyopathy   PPM-St.Jude   Hypertension   UTI (urinary tract infection)   Weakness generalized   Dehydration   CHF exacerbation    Time spent: 65 mins    [facility] MD [facility] Pager [phone]. If 7PM-7AM, please contact night-coverage at [email or web address], password [email or web address] 09/25/2013, 10:31 AM  LOS: 5 days

## 2013-09-25 NOTE — Progress Notes (Signed)
ANTIBIOTIC CONSULT NOTE - INITIAL  Pharmacy Consult for Vancomycin and Zosyn Indication: pneumonia  Allergies  Allergen Reactions  . Codeine Other (See Comments)    Unsure of reaction    Patient Measurements: Height: 5\' 4"  (162.6 cm) Weight: 144 lb 6.4 oz (65.499 kg) IBW/kg (Calculated) : 54.7 Adjusted Body Weight: n/a  Vital Signs: Temp: 98.4 F (36.9 C) (01/18 1815) Temp src: Oral (01/18 1815) BP: 118/66 mmHg (01/18 1919) Pulse Rate: 113 (01/18 1919) Intake/Output from previous day:   Intake/Output from this shift:    Labs:  Recent Labs  09/23/13 1510 09/24/13 0421 09/25/13 0536 09/25/13 1730  WBC 15.6* 15.8*  --  17.0*  HGB 11.9* 13.2  --  13.1  PLT 372 446*  --  415*  CREATININE 0.82 0.61 0.61 0.57   Estimated Creatinine Clearance: 45.2 ml/min (by C-G formula based on Cr of 0.57). No results found for this basename: VANCOTROUGH, Corlis Leak, VANCORANDOM, [place], [place], [place], [place], TOBRAPEAK, TOBRARND, AMIKACINPEAK, AMIKACINTROU, AMIKACIN,  in the last 72 hours   Microbiology: Recent Results (from the past 720 hour(s))  URINE CULTURE     Status: None   Collection Time    09/25/2013 11:05 PM      Result Value Range Status   Specimen Description URINE, CLEAN CATCH   Final   Special Requests NONE   Final   Culture  Setup Time     Final   Value: 09/21/2013 06:13     Performed at [place]     Final   Value: 10,000 COLONIES/ML     Performed at [facility]   Culture     Final   Value: PSEUDOMONAS AERUGINOSA     Performed at [facility]   Report Status 09/24/2013 FINAL   Final   Organism ID, Bacteria PSEUDOMONAS AERUGINOSA   Final  CULTURE, BLOOD (ROUTINE X 2)     Status: None   Collection Time    09/21/13 12:15 AM      Result Value Range Status   Specimen Description BLOOD LEFT ANTECUBITAL   Final   Special Requests     Final   Value: BOTTLES DRAWN AEROBIC AND ANAEROBIC AER 5cc ANA 5cc   Culture  Setup Time     Final   Value: 09/21/2013 02:20     Performed at [facility]   Culture     Final   Value:        BLOOD CULTURE RECEIVED NO GROWTH TO DATE CULTURE WILL BE HELD FOR 5 DAYS BEFORE ISSUING A FINAL NEGATIVE REPORT     Performed at [facility]   Report Status PENDING   Incomplete  CULTURE, BLOOD (ROUTINE X 2)     Status: None   Collection Time    09/21/13 12:15 AM      Result Value Range Status   Specimen Description BLOOD RIGHT ANTECUBITAL   Final   Special Requests     Final   Value: BOTTLES DRAWN AEROBIC AND ANAEROBIC AER 5cc ANA 5cc   Culture  Setup Time     Final   Value: 09/21/2013 02:20     Performed at [facility]   Culture     Final   Value:        BLOOD CULTURE RECEIVED NO GROWTH TO DATE CULTURE WILL BE HELD FOR 5 DAYS BEFORE ISSUING A FINAL NEGATIVE REPORT     Performed at [facility]   Report  Status PENDING   Incomplete  CULTURE, BLOOD (ROUTINE X 2)     Status: None   Collection Time    09/21/13  6:10 AM      Result Value Range Status   Specimen Description BLOOD RIGHT ARM   Final   Special Requests BOTTLES DRAWN AEROBIC AND ANAEROBIC 10CC EACH   Final   Culture  Setup Time     Final   Value: 09/21/2013 12:14     Performed at [facility]   Culture     Final   Value:        BLOOD CULTURE RECEIVED NO GROWTH TO DATE CULTURE WILL BE HELD FOR 5 DAYS BEFORE ISSUING A FINAL NEGATIVE REPORT     Performed at [facility]   Report Status PENDING   Incomplete  CULTURE, BLOOD (ROUTINE X 2)     Status: None   Collection Time    09/21/13  6:18 AM      Result Value Range Status   Specimen Description BLOOD RIGHT ARM   Final   Special Requests BOTTLES DRAWN AEROBIC AND ANAEROBIC 10CC EACH   Final   Culture  Setup Time     Final   Value: 09/21/2013 12:14     Performed at [facility]   Culture     Final   Value:        BLOOD CULTURE RECEIVED NO GROWTH TO DATE CULTURE WILL BE HELD FOR 5 DAYS BEFORE  ISSUING A FINAL NEGATIVE REPORT     Performed at [facility]   Report Status PENDING   Incomplete    Medical History: Past Medical History  Diagnosis Date  . Hemorrhoids   . Hypertension   . Anemia   . Diverticulitis   . Hyperlipidemia   . Depression   . Anxiety   . CAD (coronary artery disease)      a. NSTEMI 7/12 with cath: dLM 20%, mLAD 60%, dLAD 30%, Dx 40%, mCFX at AV groove 30%, prox to mid RCA 30-40%, PDA 50%, EF 35% with apical ballooning and DK of the apex;      . Takotsubo cardiomyopathy     a. echo 7/12:  EF 35%, inf-sep, AS and inf HK, mild LVH, mild MR;  b. echo 10/12: EF 60%, mild LAE  . Syncope     2/2 mobitz 2  . Mobitz type 2 second degree atrioventricular block   . LBBB (left bundle branch block)   . Biventricular cardiac pacemaker in situ     St. Jude    Medications:  Scheduled:  . aspirin EC  81 mg Oral Daily  . atorvastatin  5 mg Oral Daily  . carvedilol  3.125 mg Oral BID WC  . diazepam  5 mg Oral Daily  . enoxaparin (LOVENOX) injection  40 mg Subcutaneous Daily  . ferrous sulfate  325 mg Oral Q breakfast  . furosemide      . [START ON 09/26/2013] furosemide  40 mg Intravenous Q12H  . gabapentin  200 mg Oral TID  . omega-3 acid ethyl esters  1 g Oral BID  . oseltamivir  30 mg Oral BID  . polyethylene glycol  17 g Oral BID  . venlafaxine XR  150 mg Oral Daily   Assessment  80 yr female admitted 1/14 with cough.  Previously receiving levaquin.  Today has desats and feeling worse.  WBC up to 17, and now has fever up to 102.4.  Pharmacy asked to  broaden antibiotics to vancomycin and zosyn.  Scr 0.57, estimated CrCl ~ 45 ml/min.  Goal of Therapy:  Vancomycin trough level 15-20 mcg/ml  Plan:  1. Vancomycin 500 mg IV q 12 hrs. 2. Zosyn 3.375g IV q 8 hrs (extended-interval 4 hr infusion). 3. Will monitor clinical course, cultures, and renal function.  Uvaldo Rising, BCPS  Clinical Pharmacist Pager [phone]  09/25/2013 7:58  PM

## 2013-09-25 NOTE — Progress Notes (Addendum)
Occupational Therapy Treatment Patient Details Name: [name] MRN: [id number] DOB: 1927-04-13 Today's Date: 09/25/2013 Time: 6967-8938 OT Time Calculation (min): 18 min  OT Assessment / Plan / Recommendation  History of present illness 80 yo female with 4 days of cough, nasal congestion, low grade temp , not feeling well, not eating well.  Went to see pcp several days ago diagnosed with a viral syndrome, flu not checked.  Coughing has persisted with more sob.  No temp over 100.  No n/v/d.  Has not eaten or drank hardly anything and just feeling very weak.  No rashes.  No le edema or swelling.  No pnd or orthopnea.   OT comments  Pt very lethargic today (suspect due to meds). Pt ambulated on 2L of O2 and O2 at 89% at end of session. Nurse notified and O2 increased to 2.5L. Daughter in law states that no one will be with pt 24/7 upon d/c and OT/PT recommending this. Will further assess tomorrow (pt lethargic today), but may need further rehab prior to d/c home.  Follow Up Recommendations  No OT follow up;Supervision/Assistance - 24 hour    Barriers to Discharge       Equipment Recommendations  None recommended by OT    Recommendations for Other Services    Frequency Min 2X/week   Progress towards OT Goals Progress towards OT goals: Not progressing toward goals - comment  Plan Discharge plan remains appropriate    Precautions / Restrictions Precautions Precautions: Fall Restrictions Weight Bearing Restrictions: No   Pertinent Vitals/Pain No pain reported. Pt ambulated on 2L of O2 and O2 at 89% at end of session. Nurse notified and O2 increased to 2.5L.     ADL  Grooming: Wash/dry face Where Assessed - Grooming: Supported sitting Upper Body Bathing: Set up;Supervision/safety;Right arm;Left arm Where Assessed - Upper Body Bathing: Supported sitting Lower Body Bathing: Min guard Where Assessed - Lower Body Bathing: Supported sit to Lobbyist: Building services engineer Method: Sit to Loss adjuster, chartered: Comfort height toilet;Grab bars Toileting - Water quality scientist and Hygiene: Min guard Where Assessed - Best boy and Hygiene: Standing Equipment Used: Gait belt Transfers/Ambulation Related to ADLs: Min guard for ambulation and transfers. ADL Comments: Educated on tub transfer technique. Educated on energy conservation techniques and pt performed LB bathing at sink and washed face as well. Pt very lethargic during session. Briefly explained chair push ups to daughter in law for pt to perform to address UE weakness.    OT Diagnosis:    OT Problem List:   OT Treatment Interventions:     OT Goals(current goals can now be found in the care plan section) Acute Rehab OT Goals Patient Stated Goal: not stated OT Goal Formulation: With patient Time For Goal Achievement: 09/30/13 Potential to Achieve Goals: Good ADL Goals Pt Will Transfer to Toilet: with modified independence;ambulating (comfort height toilet) Pt Will Perform Toileting - Clothing Manipulation and hygiene: with modified independence;sit to/from stand Pt Will Perform Tub/Shower Transfer: Tub transfer;with modified independence;ambulating Additional ADL Goal #1: Pt admitted will be able to independently implement 3 energy conservation strategies as needed.  Visit Information  Last OT Received On: 09/25/13 Assistance Needed: +1 History of Present Illness: 80 yo female with 4 days of cough, nasal congestion, low grade temp , not feeling well, not eating well.  Went to see pcp several days ago diagnosed with a viral syndrome, flu not checked.  Coughing has persisted with more sob.  No temp over 100.  No n/v/d.  Has not eaten or drank hardly anything and just feeling very weak.  No rashes.  No le edema or swelling.  No pnd or orthopnea.    Subjective Data      Prior Functioning       Cognition  Cognition Arousal/Alertness:  Lethargic Behavior During Therapy: WFL for tasks assessed/performed Overall Cognitive Status: Within Functional Limits for tasks assessed    Mobility  Bed Mobility Overal bed mobility: Needs Assistance Bed Mobility: Supine to Sit Supine to sit: Mod assist General bed mobility comments: assist with trunk. Transfers Overall transfer level: Needs assistance Equipment used: None Transfers: Sit to/from Stand Sit to Stand: Min guard General transfer comment: Min guard as pt was very lethargic     Exercises      Balance    End of Session OT - End of Session Equipment Utilized During Treatment: Gait belt;Oxygen Activity Tolerance: Patient limited by lethargy Patient left: in chair;with call bell/phone within reach;with family/visitor present Nurse Communication: Other (comment) (O2)  GO      Benito Mccreedy OTR/L [phone] 09/25/2013, 2:13 PM

## 2013-09-25 NOTE — Consult Note (Addendum)
Name: [name] MRN: [id number] DOB: 09/09/1926    ADMISSION DATE:  09/11/2013 CONSULTATION DATE:  09/25/13  REFERRING MD :  Grandville Silos  PRIMARY SERVICE:  [facility]   CHIEF COMPLAINT:  Abnormal CT, PNA   BRIEF PATIENT DESCRIPTION: 80yo female never smoker with hx CAD, takotsubo cardiomyopathy, HTN admitted 1/14 by [facility] with CAP and UTI.  Cont to have hypoxia and CT chest showed LUL pulm nodule and PCCM consulted.   SIGNIFICANT EVENTS / STUDIES:  CTA chest 1/17>>> No PE, peribronchovascular soft tissue thickening extending from the left and right hilum into the lower lobes, Left upper lobe pulmonary nodule and mediastinal lymphadenopathy, BLL consolidation   LINES / TUBES: none  CULTURES: Urine 1/13>>> psuedomonas (pan sens)  BCx2 1/14>>>  Urine legionella 1/14>>> NEG  Urine strep 1/14>>> NEG  Flu PCR 1/14>> NEG   ANTIBIOTICS: Levaquin 1/16>>> Tamiflu 1/15>>>  HISTORY OF PRESENT ILLNESS:  80 yo female with hx HTN, CAD, takotsubo cardiomyopathy presented 1/14 with 4 day hx cough, congestion, fever, malaise.  She was admitted and also found to have UTI.  Treated initially with rocephin, azithro and empiric tamiflu (although flu neg).  Now on levaquin.  SHe was improving overall but remained hypoxic requiring O2 and CT chest was done to r/o PE.  Was neg for PE but showed LUL nodule and PCCM consulted. She feels much better overall.  Remains on 2L [place] with sats low 90's.  Still c/o some mild cough.  Denies fevers, chest pain, weight loss, hemoptysis, currently denies SOB.   PAST MEDICAL HISTORY :  Past Medical History  Diagnosis Date  . Hemorrhoids   . Hypertension   . Anemia   . Diverticulitis   . Hyperlipidemia   . Depression   . Anxiety   . CAD (coronary artery disease)      a. NSTEMI 7/12 with cath: dLM 20%, mLAD 60%, dLAD 30%, Dx 40%, mCFX at AV groove 30%, prox to mid RCA 30-40%, PDA 50%, EF 35% with apical ballooning and DK of the apex;      . Takotsubo  cardiomyopathy     a. echo 7/12:  EF 35%, inf-sep, AS and inf HK, mild LVH, mild MR;  b. echo 10/12: EF 60%, mild LAE  . Syncope     2/2 mobitz 2  . Mobitz type 2 second degree atrioventricular block   . LBBB (left bundle branch block)   . Biventricular cardiac pacemaker in situ     St. Jude   Past Surgical History  Procedure Laterality Date  . Carpal tunnel release    . Inguinal hernia repair    . Splenectomy    . Cholecystectomy    . Appendectomy    . Tubal ligation    . Pacemaker insertion      SJM BiV pacemaker implanted 7/12 by Dr Rayann Heman  . Trigger finger release Left 02/17/2013    Procedure: RELEASE TRIGGER FINGER/A-1 PULLEY LEFT LONG AND LEFT RING;  Surgeon: Cammie Sickle., MD;  Location: [facility];  Service: Orthopedics;  Laterality: Left;   Prior to Admission medications   Medication Sig Start Date End Date Taking? Authorizing Provider  acetaminophen (TYLENOL) 325 MG tablet Take 650 mg by mouth every 6 (six) hours as needed for moderate pain.   Yes Historical Provider, MD  aspirin 81 MG tablet Take 81 mg by mouth daily.     Yes Historical Provider, MD  atorvastatin (LIPITOR) 10 MG tablet Take 5 mg by  mouth daily.    Yes Historical Provider, MD  buPROPion (WELLBUTRIN) 75 MG tablet Take 75 mg by mouth daily.   Yes Historical Provider, MD  calcium-vitamin D (OSCAL WITH D) 500-200 MG-UNIT per tablet Take 1 tablet by mouth 2 (two) times daily.   Yes Historical Provider, MD  carvedilol (COREG) 3.125 MG tablet Take 3.125 mg by mouth 2 (two) times daily with a meal.   Yes Historical Provider, MD  diazepam (VALIUM) 5 MG tablet Take 5 mg by mouth daily as needed for anxiety.   Yes Historical Provider, MD  ferrous sulfate ([employer] IRON) 325 (65 FE) MG tablet Take 325 mg by mouth daily with breakfast.    Yes Historical Provider, MD  fish oil-omega-3 fatty acids 1000 MG capsule Take 2 g by mouth 2 (two) times daily.    Yes Historical Provider, MD  gabapentin (NEURONTIN)  100 MG capsule Take 200 mg by mouth 3 (three) times daily.   Yes Historical Provider, MD  lisinopril-hydrochlorothiazide (PRINZIDE,ZESTORETIC) 20-12.5 MG per tablet Take 0.5 tablets by mouth daily.   Yes Historical Provider, MD  multivitamin-iron-minerals-folic acid (CENTRUM) chewable tablet Chew 1 tablet by mouth daily.     Yes Historical Provider, MD  promethazine-codeine (PHENERGAN WITH CODEINE) 6.25-10 MG/5ML syrup Take 5 mLs by mouth every 6 (six) hours as needed for cough.   Yes Historical Provider, MD  senna (SENOKOT) 8.6 MG TABS tablet Take 1 tablet by mouth daily as needed for mild constipation.   Yes Historical Provider, MD  traZODone (DESYREL) 50 MG tablet Take 50 mg by mouth at bedtime.     Yes Historical Provider, MD  venlafaxine XR (EFFEXOR-XR) 150 MG 24 hr capsule Take 150 mg by mouth daily.   Yes Historical Provider, MD   Allergies  Allergen Reactions  . Codeine Other (See Comments)    Unsure of reaction    FAMILY HISTORY:  Family History  Problem Relation Age of Onset  . Coronary artery disease Neg Hx    SOCIAL HISTORY:  reports that she has never smoked. She has never used smokeless tobacco. She reports that she does not drink alcohol or use illicit drugs.  REVIEW OF SYSTEMS:   As per HPI - all other systems reviewed and were neg.    VITAL SIGNS: Temp:  [97.4 F (36.3 C)-99.3 F (37.4 C)] 99.3 F (37.4 C) (01/18 1320) Pulse Rate:  [77-91] 87 (01/18 1320) Resp:  [18-20] 20 (01/18 1320) BP: (104-148)/(50-75) 121/54 mmHg (01/18 1320) SpO2:  [90 %-98 %] 98 % (01/18 1320)  PHYSICAL EXAMINATION: General:  Frail elderly female, NAD OOB in chair  Neuro:  Drowsy after valium but appropriate, MAE, gen weakness  HEENT:  Mm moist, no JVD  Cardiovascular:  s1s2 rrr Lungs:  resps even non labored on [place], diminished bases, few scattered rhonchi  Abdomen:  Soft, +bs Musculoskeletal:  Warm and dry, no edema   Recent Labs Lab 09/23/13 1510 09/24/13 0421 09/25/13 0536    NA 126* 134* 128*  K 3.7 4.5 4.0  CL 90* 95* 91*  CO2 22 24 23   BUN 15 11 7   CREATININE 0.82 0.61 0.61  GLUCOSE 181* 116* 179*    Recent Labs Lab 09/23/13 0443 09/23/13 1510 09/24/13 0421  HGB 13.0 11.9* 13.2  HCT 37.7 35.8* 39.0  WBC 14.6* 15.6* 15.8*  PLT 408* 372 446*   Dg Abd 1 View  09/23/2013   CLINICAL DATA:  Nausea, emesis, right-sided abdominal pain  EXAM: ABDOMEN - 1 VIEW  COMPARISON:  05/18/2012  FINDINGS: Mild scoliosis of the lumbar spine with calcification of the aorta. Nonobstructive bowel gas pattern. Mild to moderate fecal retention.  IMPRESSION: Mild to moderate fecal retention particularly in the ascending colon. No acute findings otherwise.   Electronically Signed   By: Skipper Cliche M.D.   On: 09/23/2013 16:13   Ct Angio Chest Pe W/cm &/or Wo Cm  09/24/2013   CLINICAL DATA:  hypoxia  EXAM: CT ANGIOGRAPHY CHEST WITH CONTRAST  TECHNIQUE: Multidetector CT imaging of the chest was performed using the standard protocol during bolus administration of intravenous contrast. Multiplanar CT image reconstructions including MIPs were obtained to evaluate the vascular anatomy.  CONTRAST:  47mL OMNIPAQUE IOHEXOL 350 MG/ML SOLN  COMPARISON:  DG CHEST 2 VIEW dated 09/17/2013; CT ABD-PELV WO/W CM (HEMATURIA) dated 09/06/2010; DG CHEST 2 VIEW dated 09/26/2012  FINDINGS: There are no filling defects within the pulmonary arteries to suggest acute pulmonary embolism. No acute findings of the aorta or great vessels. There is however extensive perihilar bronchovascular soft tissue thickening extending from the hilum into the left or right lower lobes surrounding the bronchi (image 71 and 64 for example). Bilateral pleural effusions. There is consolidation and atelectasis in the left and right lower lobe extending from bronchovascular thickening.  There is an enlarged subcarinal lymph node measuring 2.4 cm. There is a large prevascular lymph node measuring 16 mm. Within the left upper lobe  there is a rounded 2.5 x 17 mm nodule (image 56, series 6). There is mild nodularity interlobular septal thickening in the upper lobes  No axillary or supraclavicular adenopathy.  Limited view of the upper abdomen is unremarkable. Limited view of the skeleton is unremarkable.  Review of the MIP images confirms the above findings.  IMPRESSION: 1. No evidence acute pulmonary embolism. 2. Extensive peribronchovascular soft tissue thickening extending from the left and right hilum into the lower lobes. There is consolidation peripheral to this bronchovascular thickening. Differential includes lymphangitic carcinoma, versus a inflammatory infectious process. 3. Left upper lobe pulmonary nodule and mediastinal lymphadenopathy is concerning for a neoplastic process. 4. Lower lobe bronchoscopy may be valuable for determine etiology. 5. Consolidation in the lower lobes could suggests superimposed infectious process. 6. Patient may ultimately benefit from an outpatient FDG PET scan.   Electronically Signed   By: Suzy Bouchard M.D.   On: 09/24/2013 17:56    ASSESSMENT / PLAN: Principal Problem:   PNA (pneumonia) Active Problems:   CAD (coronary artery disease)   Takotsubo cardiomyopathy   PPM-St.Jude   Hypertension   UTI (urinary tract infection)   Weakness generalized   Dehydration   CHF exacerbation   Bronchiectasis with acute exacerbation   LUL pulmonary nodule - with mediastinal lymphadenopathy. Suspect extensive soft tissue thickening r/t acute infectious/viral process. CAP/ viral respiratory illness  Bronchiectasis Perivascular and peribronchial nodularity, unusual distribution  ?bronchcentric granuloma?  Mycobacterial infection?  Fungal infection? Note no eosinophilia. May need FOB to sort out  Pt is never smoker but she has extensive second-hand smoke exposure from her husband (deceased) who smoked heavily inside the home for >50 years.  No occupational exposures.    REC -  Cont abx for  CAP  Likely needs home O2 at least for the short term  pulm hygiene , add flutter valve Ig E, M, A, G assays Fungal panel Hypersensitivity panel Will likely need FOB for bx - consider completing abx course and maximize resp status then f/u as outpt for further w/u nodule ,  an may then do FOB as outpt later, or may decide to do so while inpatient.  I will have hospital based pulmonary MD f/u this pt in AM 1/19 to decide.  Mariel Sleet Beeper  [phone]  Cell  [phone]  If no response or cell goes to voicemail, call beeper [phone]  09/25/2013  1:24 PM

## 2013-09-26 ENCOUNTER — Encounter (HOSPITAL_COMMUNITY): Payer: Self-pay

## 2013-09-26 DIAGNOSIS — N390 Urinary tract infection, site not specified: Secondary | ICD-10-CM

## 2013-09-26 DIAGNOSIS — N179 Acute kidney failure, unspecified: Secondary | ICD-10-CM

## 2013-09-26 DIAGNOSIS — J9600 Acute respiratory failure, unspecified whether with hypoxia or hypercapnia: Secondary | ICD-10-CM

## 2013-09-26 DIAGNOSIS — R579 Shock, unspecified: Secondary | ICD-10-CM

## 2013-09-26 LAB — CBC
HEMATOCRIT: 38.1 % (ref 36.0–46.0)
HEMOGLOBIN: 13.1 g/dL (ref 12.0–15.0)
MCH: 32.8 pg (ref 26.0–34.0)
MCHC: 34.4 g/dL (ref 30.0–36.0)
MCV: 95.5 fL (ref 78.0–100.0)
Platelets: 340 10*3/uL (ref 150–400)
RBC: 3.99 MIL/uL (ref 3.87–5.11)
RDW: 13.2 % (ref 11.5–15.5)
WBC: 23.2 10*3/uL — AB (ref 4.0–10.5)

## 2013-09-26 LAB — BLOOD GAS, ARTERIAL
ACID-BASE DEFICIT: 5.7 mmol/L — AB (ref 0.0–2.0)
Bicarbonate: 19.3 mEq/L — ABNORMAL LOW (ref 20.0–24.0)
Drawn by: 252031
FIO2: 1 %
O2 SAT: 96.7 %
PEEP: 10 cmH2O
Patient temperature: 98.6
RATE: 20 resp/min
TCO2: 20.5 mmol/L (ref 0–100)
VT: 430 mL
pCO2 arterial: 38.8 mmHg (ref 35.0–45.0)
pH, Arterial: 7.317 — ABNORMAL LOW (ref 7.350–7.450)
pO2, Arterial: 104 mmHg — ABNORMAL HIGH (ref 80.0–100.0)

## 2013-09-26 LAB — BASIC METABOLIC PANEL
BUN: 11 mg/dL (ref 6–23)
CO2: 17 meq/L — AB (ref 19–32)
CREATININE: 1.33 mg/dL — AB (ref 0.50–1.10)
Calcium: 8.1 mg/dL — ABNORMAL LOW (ref 8.4–10.5)
Chloride: 94 mEq/L — ABNORMAL LOW (ref 96–112)
GFR calc Af Amer: 41 mL/min — ABNORMAL LOW (ref >90)
GFR calc non Af Amer: 36 mL/min — ABNORMAL LOW (ref >90)
GLUCOSE: 231 mg/dL — AB (ref 70–99)
Potassium: 4.8 mEq/L (ref 3.7–5.3)
Sodium: 128 mEq/L — ABNORMAL LOW (ref 137–147)

## 2013-09-26 LAB — PROCALCITONIN: PROCALCITONIN: 3.42 ng/mL

## 2013-09-26 LAB — IGE: IgE (Immunoglobulin E), Serum: 36.3 IU/mL (ref 0.0–180.0)

## 2013-09-26 LAB — POCT I-STAT 3, ART BLOOD GAS (G3+)
ACID-BASE DEFICIT: 8 mmol/L — AB (ref 0.0–2.0)
Bicarbonate: 16.6 mEq/L — ABNORMAL LOW (ref 20.0–24.0)
O2 SAT: 93 %
Patient temperature: 99
TCO2: 18 mmol/L (ref 0–100)
pCO2 arterial: 31.7 mmHg — ABNORMAL LOW (ref 35.0–45.0)
pH, Arterial: 7.327 — ABNORMAL LOW (ref 7.350–7.450)
pO2, Arterial: 73 mmHg — ABNORMAL LOW (ref 80.0–100.0)

## 2013-09-26 LAB — GLUCOSE, CAPILLARY
Glucose-Capillary: 220 mg/dL — ABNORMAL HIGH (ref 70–99)
Glucose-Capillary: 185 mg/dL — ABNORMAL HIGH (ref 70–99)
Glucose-Capillary: 187 mg/dL — ABNORMAL HIGH (ref 70–99)
Glucose-Capillary: 179 mg/dL — ABNORMAL HIGH (ref 70–99)

## 2013-09-26 MED ORDER — SODIUM CHLORIDE 0.9 % IV BOLUS (SEPSIS)
3000.0000 mL | Freq: Once | INTRAVENOUS | Status: AC
  Administered 2013-09-26: 3000 mL via INTRAVENOUS

## 2013-09-26 MED ORDER — INSULIN ASPART 100 UNIT/ML ~~LOC~~ SOLN
0.0000 [IU] | SUBCUTANEOUS | Status: DC
  Administered 2013-09-26 (×3): 2 [IU] via SUBCUTANEOUS
  Administered 2013-09-27: 3 [IU] via SUBCUTANEOUS
  Administered 2013-09-27 – 2013-09-29 (×11): 2 [IU] via SUBCUTANEOUS
  Administered 2013-09-29: 5 [IU] via SUBCUTANEOUS
  Administered 2013-09-29: 1 [IU] via SUBCUTANEOUS
  Administered 2013-09-29 (×2): 3 [IU] via SUBCUTANEOUS
  Administered 2013-09-29: 2 [IU] via SUBCUTANEOUS
  Administered 2013-09-30 (×3): 1 [IU] via SUBCUTANEOUS
  Administered 2013-09-30: 2 [IU] via SUBCUTANEOUS
  Administered 2013-10-01: 1 [IU] via SUBCUTANEOUS
  Administered 2013-10-01 (×2): 2 [IU] via SUBCUTANEOUS
  Administered 2013-10-01: 1 [IU] via SUBCUTANEOUS
  Administered 2013-10-02: 2 [IU] via SUBCUTANEOUS
  Administered 2013-10-02: 1 [IU] via SUBCUTANEOUS
  Administered 2013-10-03 (×2): 2 [IU] via SUBCUTANEOUS
  Administered 2013-10-03 – 2013-10-04 (×2): 1 [IU] via SUBCUTANEOUS
  Administered 2013-10-04 (×3): 2 [IU] via SUBCUTANEOUS

## 2013-09-26 MED ORDER — OXEPA PO LIQD
1000.0000 mL | ORAL | Status: DC
  Administered 2013-09-26 – 2013-10-02 (×5): 1000 mL
  Filled 2013-09-26 (×8): qty 1000

## 2013-09-26 MED ORDER — HYDROCORTISONE SOD SUCCINATE 100 MG IJ SOLR
50.0000 mg | Freq: Four times a day (QID) | INTRAMUSCULAR | Status: DC
  Administered 2013-09-26 (×2): via INTRAVENOUS
  Administered 2013-09-26 – 2013-09-27 (×2): 50 mg via INTRAVENOUS
  Filled 2013-09-26 (×8): qty 1

## 2013-09-26 MED ORDER — OXEPA PO LIQD
1000.0000 mL | ORAL | Status: DC
  Filled 2013-09-26 (×2): qty 1000

## 2013-09-26 MED ORDER — ENOXAPARIN SODIUM 30 MG/0.3ML ~~LOC~~ SOLN
30.0000 mg | Freq: Every day | SUBCUTANEOUS | Status: DC
  Administered 2013-09-26 – 2013-09-27 (×2): 30 mg via SUBCUTANEOUS
  Filled 2013-09-26 (×2): qty 0.3

## 2013-09-26 MED ORDER — FENTANYL CITRATE 0.05 MG/ML IJ SOLN
100.0000 ug | Freq: Once | INTRAMUSCULAR | Status: AC
  Administered 2013-09-25: 100 ug via INTRAVENOUS

## 2013-09-26 MED ORDER — PANTOPRAZOLE SODIUM 40 MG IV SOLR
40.0000 mg | INTRAVENOUS | Status: DC
  Administered 2013-09-26 – 2013-09-27 (×2): 40 mg via INTRAVENOUS
  Filled 2013-09-26 (×4): qty 40

## 2013-09-26 MED ORDER — SODIUM CHLORIDE 0.9 % IV BOLUS (SEPSIS)
1000.0000 mL | Freq: Once | INTRAVENOUS | Status: AC
  Administered 2013-09-26: 1000 mL via INTRAVENOUS

## 2013-09-26 MED ORDER — MIDAZOLAM BOLUS VIA INFUSION
1.0000 mg | INTRAVENOUS | Status: DC | PRN
  Filled 2013-09-26: qty 2

## 2013-09-26 MED ORDER — SODIUM CHLORIDE 0.9 % IV SOLN
0.0300 [IU]/min | INTRAVENOUS | Status: DC
Start: 2013-09-26 — End: 2013-09-27
  Administered 2013-09-26: 0.03 [IU]/min via INTRAVENOUS
  Filled 2013-09-26 (×2): qty 2.5

## 2013-09-26 MED ORDER — INSULIN ASPART 100 UNIT/ML ~~LOC~~ SOLN: 1.0000 [IU] | SUBCUTANEOUS | Status: DC

## 2013-09-26 MED ORDER — ETOMIDATE 2 MG/ML IV SOLN
15.0000 mg | Freq: Once | INTRAVENOUS | Status: AC
  Administered 2013-09-25: 15 mg via INTRAVENOUS

## 2013-09-26 MED ORDER — HYDROCORTISONE SOD SUCCINATE 100 MG IJ SOLR
50.0000 mg | Freq: Four times a day (QID) | INTRAMUSCULAR | Status: DC
  Filled 2013-09-26 (×4): qty 1

## 2013-09-26 MED ORDER — SODIUM CHLORIDE 0.9 % IV SOLN
0.0000 mg/h | INTRAVENOUS | Status: DC
  Administered 2013-09-26: 1 mg/h via INTRAVENOUS
  Administered 2013-09-26 – 2013-09-27 (×2): 3 mg/h via INTRAVENOUS
  Filled 2013-09-26 (×3): qty 10

## 2013-09-26 MED ORDER — SUCCINYLCHOLINE CHLORIDE 20 MG/ML IJ SOLN
80.0000 mg | Freq: Once | INTRAMUSCULAR | Status: AC
  Administered 2013-09-25: 80 mg via INTRAVENOUS
  Filled 2013-09-26: qty 4

## 2013-09-26 MED ORDER — CHLORHEXIDINE GLUCONATE 0.12 % MT SOLN
15.0000 mL | Freq: Two times a day (BID) | OROMUCOSAL | Status: DC
  Administered 2013-09-26 – 2013-10-04 (×17): 15 mL via OROMUCOSAL
  Filled 2013-09-26 (×16): qty 15

## 2013-09-26 MED ORDER — VANCOMYCIN HCL IN DEXTROSE 750-5 MG/150ML-% IV SOLN
750.0000 mg | INTRAVENOUS | Status: DC
  Administered 2013-09-26 – 2013-09-27 (×2): 750 mg via INTRAVENOUS
  Filled 2013-09-26 (×3): qty 150

## 2013-09-26 MED ORDER — BIOTENE DRY MOUTH MT LIQD
15.0000 mL | Freq: Four times a day (QID) | OROMUCOSAL | Status: DC
  Administered 2013-09-26 – 2013-10-04 (×33): 15 mL via OROMUCOSAL

## 2013-09-26 MED ORDER — PRO-STAT SUGAR FREE PO LIQD
30.0000 mL | Freq: Two times a day (BID) | ORAL | Status: DC
  Administered 2013-09-26 – 2013-10-03 (×15): 30 mL
  Filled 2013-09-26 (×16): qty 30

## 2013-09-26 MED ORDER — SODIUM CHLORIDE 0.9 % IV BOLUS (SEPSIS)
1000.0000 mL | INTRAVENOUS | Status: AC | PRN
  Administered 2013-09-26 (×5): 1000 mL via INTRAVENOUS

## 2013-09-26 NOTE — Procedures (Signed)
Intubation Procedure Note [name] [id number] 03-05-1927  Procedure: Intubation Indications: Respiratory insufficiency, airway protection  Procedure Details Consent: Risks of procedure as well as the alternatives and risks of each were explained to the patient and caregiver.  Consent for procedure obtained.  Time Out: Verified patient identification, verified procedure, site/side was marked, verified correct patient position, special equipment/implants available, medications/allergies/relevent history reviewed, required imaging and test results available.  Performed  Glidescope used.   Evaluation Hemodynamic Status: Transient hypotension treated with pressors; O2 sats: stable throughout in high 80's-90's Patient's Current Condition: stable Complications: No apparent complications Patient did tolerate procedure well. Chest X-ray ordered to verify placement.  CXR: tube position acceptable.    Of note: On CXR, ETT appears to be coming in from the left side.  On physical exam, left submandibular mobile mass noted, LN vs lipoma.  Unlikely hematoma, no evidence for tension pneumo, will continue to monitor.   Signed, Elnora Morrison, PGY3 09/26/2013, 12:16 AM  I was present for the entire procedure.   Waynetta Pean, M.D.  Pulmonary and [place]  Pager: [phone]

## 2013-09-26 NOTE — Progress Notes (Signed)
INITIAL NUTRITION ASSESSMENT  DOCUMENTATION CODES Per approved criteria  -Not Applicable   INTERVENTION:  Initiate TF via OGT with Oxepa at 20 ml/h, increase by 10 ml every 4 hours to goal rate of 40 ml/h with Prostat 30 ml BID to provide 1640 kcals, 90 gm protein, 754 ml free water daily.  NUTRITION DIAGNOSIS: Inadequate oral intake related to inability to eat as evidenced by NPO status.   Goal: Intake to meet >90% of estimated nutrition needs.  Monitor:  TF tolerance/adequacy, weight trend, labs, vent status.  Reason for Assessment: MD Consult for TF initiation and management.  80 y.o. female  Admitting Dx: Acute respiratory distress  ASSESSMENT: Patient is a 80 yo female with 4 days of cough, nasal congestion, low grade temp , not feeling well, not eating well. Went to see pcp several days ago diagnosed with a viral syndrome, flu not checked. Patient with a hx CAD, takotsubo cardiomyopathy, HTN admitted 1/14 by [facility] with CAP and UTI. Cont to have hypoxia and CT chest showed LUL pulmonary nodule.  Patient developed respiratory distress on 1/18 and required intubation overnight 1/19. Oxepa TF has been ordered per protocol, not initiated yet. Nutrition focused physical exam completed.  No muscle or subcutaneous fat depletion noticed.  Patient is currently intubated on ventilator support.  MV: 8.3 L/min Temp (24hrs), Avg:99.2 F (37.3 C), Min:97.4 F (36.3 C), Max:102.4 F (39.1 C)    Height: Ht Readings from Last 1 Encounters:  09/26/13 5\' 4"  (1.626 m)    Weight: Wt Readings from Last 1 Encounters:  09/26/13 144 lb 10 oz (65.6 kg)    Ideal Body Weight: 54.5 kg  % Ideal Body Weight: 120%  Wt Readings from Last 10 Encounters:  09/26/13 144 lb 10 oz (65.6 kg)  07/12/13 147 lb 6.4 oz (66.86 kg)  03/16/13 146 lb 9.6 oz (66.497 kg)  02/17/13 144 lb (65.318 kg)  02/17/13 144 lb (65.318 kg)  09/30/12 142 lb (64.411 kg)  07/01/12 150 lb 12.8 oz (68.402 kg)   03/22/12 142 lb (64.411 kg)  07/01/11 140 lb 12.8 oz (63.866 kg)  06/20/11 142 lb (64.411 kg)    Usual Body Weight: 144-147 lb  % Usual Body Weight: 100%  BMI:  Body mass index is 24.81 kg/(m^2).  Estimated Nutritional Needs: Kcal: 1630 Protein: 80-95 gm Fluid: 1.6-1.8 L  Skin: no wounds  Diet Order: NPO  EDUCATION NEEDS: -Education not appropriate at this time   Intake/Output Summary (Last 24 hours) at 09/26/13 1144 Last data filed at 09/26/13 0900  Gross per 24 hour  Intake 3615.02 ml  Output    530 ml  Net 3085.02 ml    Last BM: PTA   Labs:   Recent Labs Lab 09/25/13 0536 09/25/13 1730 09/26/13 0400  NA 128* 127* 128*  K 4.0 4.3 4.8  CL 91* 89* 94*  CO2 23 22 17*  BUN 7 6 11   CREATININE 0.61 0.57 1.33*  CALCIUM 9.1 8.9 8.1*  GLUCOSE 179* 137* 231*    CBG (last 3)   Recent Labs  09/25/13 1954 09/25/13 2300 09/26/13 0730  GLUCAP 157* 130* 220*    Scheduled Meds: . antiseptic oral rinse  15 mL Mouth Rinse QID  . aspirin EC  81 mg Oral Daily  . atorvastatin  5 mg Oral Daily  . chlorhexidine  15 mL Mouth Rinse BID  . enoxaparin (LOVENOX) injection  30 mg Subcutaneous Daily  . gabapentin  200 mg Oral TID  . hydrocortisone sod  succinate (SOLU-CORTEF) inj  50 mg Intravenous Q6H  . insulin aspart  0-9 Units Subcutaneous Q4H  . pantoprazole (PROTONIX) IV  40 mg Intravenous Q24H  . piperacillin-tazobactam (ZOSYN)  IV  3.375 g Intravenous Q8H  . polyethylene glycol  17 g Oral BID  . vancomycin  750 mg Intravenous Q24H  . venlafaxine XR  150 mg Oral Daily    Continuous Infusions: . feeding supplement (OXEPA)    . fentaNYL infusion INTRAVENOUS Stopped (09/26/13 1000)  . midazolam (VERSED) infusion Stopped (09/26/13 1000)  . norepinephrine (LEVOPHED) Adult infusion 19 mcg/min (09/26/13 1118)  . vasopressin (PITRESSIN) infusion - *FOR SHOCK* 0.03 Units/min (09/26/13 0900)    Past Medical History  Diagnosis Date  . Hemorrhoids   .  Hypertension   . Anemia   . Diverticulitis   . Hyperlipidemia   . Depression   . Anxiety   . CAD (coronary artery disease)      a. NSTEMI 7/12 with cath: dLM 20%, mLAD 60%, dLAD 30%, Dx 40%, mCFX at AV groove 30%, prox to mid RCA 30-40%, PDA 50%, EF 35% with apical ballooning and DK of the apex;      . Takotsubo cardiomyopathy     a. echo 7/12:  EF 35%, inf-sep, AS and inf HK, mild LVH, mild MR;  b. echo 10/12: EF 60%, mild LAE  . Syncope     2/2 mobitz 2  . Mobitz type 2 second degree atrioventricular block   . LBBB (left bundle branch block)   . Biventricular cardiac pacemaker in situ     St. Jude    Past Surgical History  Procedure Laterality Date  . Carpal tunnel release    . Inguinal hernia repair    . Splenectomy    . Cholecystectomy    . Appendectomy    . Tubal ligation    . Pacemaker insertion      SJM BiV pacemaker implanted 7/12 by Dr Rayann Heman  . Trigger finger release Left 02/17/2013    Procedure: RELEASE TRIGGER FINGER/A-1 PULLEY LEFT LONG AND LEFT RING;  Surgeon: Cammie Sickle., MD;  Location: [facility];  Service: Orthopedics;  Laterality: Left;     Molli Barrows, RD, LDN, [place] Pager [phone] After Hours Pager [phone]

## 2013-09-26 NOTE — Progress Notes (Signed)
Name: [name] MRN: [id number] DOB: 25-Oct-1926    ADMISSION DATE:  10/02/2013 CONSULTATION DATE:  09/25/13  REFERRING MD :  [place]ville Silos PRIMARY SERVICE: [facility]  CHIEF COMPLAINT: Abnormal CT, pneumonia, hypoxia  BRIEF PATIENT DESCRIPTION: 80 y/o female never smoker with PMHx of CAD, takotsubo cardiomyopathy, HTN, AV block s/p PPM placement, presented 1/14 with 4 day history of cough, congestion, fever, malaise. She was admitted and also found to have UTI. Treated initially with rocephin, azithro and empiric tamiflu (although flu neg), switched to Levaquin. Patient continued to have hypoxia, CT chest showed LUL pulm nodule and PCCM consulted.   SIGNIFICANT EVENTS / STUDIES:  CT chest 1/17 >>> No PE, peribronchovascular soft tissue thickening extending from the left and right hilum into the lower lobes, Left upper lobe pulmonary nodule and mediastinal lymphadenopathy, BLL consolidation.    LINES / TUBES: ETT 1/19 >>> RIJ CVL 1/19 >>>  CULTURES: Urine 1/13>>> psuedomonas (pan sens)  BCx2 1/14>>>  Urine legionella 1/14>>> NEG  Urine strep 1/14>>> NEG  Flu PCR 1/14>> NEG  MRSA 1/18 >>> NEG Blood 1/19 >>> Resp 1/19 >>>  ANTIBIOTICS: Azithro 1/14 >>> 1/14 Rocephin 1/14 >>> 1/14 Levaquin 1/16 >>>1/18 Zosyn 1/18 >>> Vancomycin 1/18 >>> Tamiflu 1/15 >>> 1/19  HISTORY OF PRESENT ILLNESS:  Transferred to the ICU overnight, intubated, central line placed. Cr elevated to 1.33, suggesting new AKI. Increased leukocytosis to 23.2 this AM.   SUBJECTIVE:   VITAL SIGNS: Temp:  [97.4 F (36.3 C)-102.4 F (39.1 C)] 99 F (37.2 C) (01/19 0402) Pulse Rate:  [68-119] 98 (01/19 0600) Resp:  [0-29] 20 (01/19 0600) BP: (48-150)/(30-77) 95/59 mmHg (01/19 0600) SpO2:  [82 %-99 %] 98 % (01/19 0600) Arterial Line BP: (91-200)/(50-92) 105/59 mmHg (01/19 0600) FiO2 (%):  [100 %] 100 % (01/19 0335) Weight:  [144 lb 10 oz (65.6 kg)] 144 lb 10 oz (65.6 kg) (01/19  0000)  HEMODYNAMICS: CVP:  [6 mmHg-14 mmHg] 6 mmHg  VENTILATOR SETTINGS: Vent Mode:  [-] PRVC FiO2 (%):  [100 %] 100 % Set Rate:  [20 bmp] 20 bmp Vt Set:  [430 mL] 430 mL PEEP:  [15 cmH20] 15 cmH20 Plateau Pressure:  [26 cmH20-28 cmH20] 28 cmH20  INTAKE / OUTPUT: Intake/Output     01/18 0701 - 01/19 0700   P.O. 90   I.V. (mL/kg) 354.6 (5.4)   IV Piggyback 3000   Total Intake(mL/kg) 3444.6 (52.5)   Urine (mL/kg/hr) 520 (0.3)   Total Output 520   Net +2924.6       Urine Occurrence 3 x     PHYSICAL EXAMINATION:  General: Frail elderly female, NAD.  Neuro: Sedated. RASS -3. HEENT: Mm moist, no JVD. ETT/OGT in place.  Cardiovascular: s1s2 rrr  Lungs: Air entry equal bilaterally. Coarse breath sounds diffusely. No wheezes.  Abdomen: Soft, +bs  Musculoskeletal: Warm and dry, no edema  LABS:  CBC  Recent Labs Lab 09/24/13 0421 09/25/13 1730 09/26/13 0400  WBC 15.8* 17.0* 23.2*  HGB 13.2 13.1 13.1  HCT 39.0 38.0 38.1  PLT 446* 415* 340   Coag's  Recent Labs Lab 09/19/2013 2135  INR 1.11   BMET  Recent Labs Lab 09/25/13 0536 09/25/13 1730 09/26/13 0400  NA 128* 127* 128*  K 4.0 4.3 4.8  CL 91* 89* 94*  CO2 23 22 17*  BUN 7 6 11   CREATININE 0.61 0.57 1.33*  GLUCOSE 179* 137* 231*   Electrolytes  Recent Labs Lab 09/25/13 0536 09/25/13 1730 09/26/13 0400  CALCIUM 9.1 8.9 8.1*   Sepsis Markers  Recent Labs Lab 09/12/2013 2159  LATICACIDVEN 1.97   ABG  Recent Labs Lab 09/25/13 1740 09/25/13 2130 09/26/13 0025  PHART 7.483* 7.448 7.317*  PCO2ART 31.9* 35.8 38.8  PO2ART 85.7 71.4* 104.0*   Liver Enzymes  Recent Labs Lab 10/02/2013 2135 09/25/13 1730  AST 57* 56*  ALT 26 37*  ALKPHOS 74 78  BILITOT 0.4 0.4  ALBUMIN 3.1* 2.3*   Cardiac Enzymes  Recent Labs Lab 09/17/2013 2317 09/21/13 0909 09/21/13 1404 09/21/13 2011 09/25/13 1719  TROPONINI <0.30 <0.30 <0.30 <0.30  --   PROBNP 818.6*  --   --   --  1223.0*    Glucose  Recent Labs Lab 09/21/13 2053 09/23/13 1437 09/25/13 1156 09/25/13 1954 09/25/13 2300  GLUCAP 101* 198* 186* 157* 130*    Imaging Ct Angio Chest Pe W/cm &/or Wo Cm  09/24/2013   CLINICAL DATA:  hypoxia  EXAM: CT ANGIOGRAPHY CHEST WITH CONTRAST  TECHNIQUE: Multidetector CT imaging of the chest was performed using the standard protocol during bolus administration of intravenous contrast. Multiplanar CT image reconstructions including MIPs were obtained to evaluate the vascular anatomy.  CONTRAST:  68mL OMNIPAQUE IOHEXOL 350 MG/ML SOLN  COMPARISON:  DG CHEST 2 VIEW dated 09/23/2013; CT ABD-PELV WO/W CM (HEMATURIA) dated 09/06/2010; DG CHEST 2 VIEW dated 09/26/2012  FINDINGS: There are no filling defects within the pulmonary arteries to suggest acute pulmonary embolism. No acute findings of the aorta or great vessels. There is however extensive perihilar bronchovascular soft tissue thickening extending from the hilum into the left or right lower lobes surrounding the bronchi (image 71 and 64 for example). Bilateral pleural effusions. There is consolidation and atelectasis in the left and right lower lobe extending from bronchovascular thickening.  There is an enlarged subcarinal lymph node measuring 2.4 cm. There is a large prevascular lymph node measuring 16 mm. Within the left upper lobe there is a rounded 2.5 x 17 mm nodule (image 56, series 6). There is mild nodularity interlobular septal thickening in the upper lobes  No axillary or supraclavicular adenopathy.  Limited view of the upper abdomen is unremarkable. Limited view of the skeleton is unremarkable.  Review of the MIP images confirms the above findings.  IMPRESSION: 1. No evidence acute pulmonary embolism. 2. Extensive peribronchovascular soft tissue thickening extending from the left and right hilum into the lower lobes. There is consolidation peripheral to this bronchovascular thickening. Differential includes lymphangitic  carcinoma, versus a inflammatory infectious process. 3. Left upper lobe pulmonary nodule and mediastinal lymphadenopathy is concerning for a neoplastic process. 4. Lower lobe bronchoscopy may be valuable for determine etiology. 5. Consolidation in the lower lobes could suggests superimposed infectious process. 6. Patient may ultimately benefit from an outpatient FDG PET scan.   Electronically Signed   By: Suzy Bouchard M.D.   On: 09/24/2013 17:56   Dg Chest Port 1 View  09/26/2013   CLINICAL DATA:  Endotracheal tube placement. Central line placement.  EXAM: PORTABLE CHEST - 1 VIEW  COMPARISON:  Chest radiograph performed earlier today at 5:32 p.m.  FINDINGS: The patient's endotracheal tube is seen ending 4 cm above the carina. A right IJ line is noted ending about the mid to distal SVC. The enteric tube is seen extending below the diaphragm.  Persistent small bilateral pleural effusions are seen, mildly improved on the right, with diffuse bilateral airspace opacification, perhaps mildly worsened. Findings are most compatible with pulmonary edema, though superimposed pneumonia cannot  be excluded. No pneumothorax is seen.  The cardiomediastinal silhouette remains normal in size. A pacemaker is noted overlying the left chest wall, with leads ending overlying the right atrium, right ventricle and coronary sinus. No acute osseous abnormalities are seen. Chronic bilateral rib deformities are again noted.  IMPRESSION: 1. Endotracheal tube seen ending 4 cm above the carina. 2. Right IJ line seen ending about the mid to distal SVC. 3. Persistent small bilateral pleural effusions, mildly improved on the right. Diffuse bilateral airspace opacification is perhaps mildly worsened. Findings are most compatible with pulmonary edema, though superimposed pneumonia cannot be excluded.   Electronically Signed   By: Garald Balding M.D.   On: 09/26/2013 00:01   Dg Chest Port 1 View  09/25/2013   CLINICAL DATA:  Respiratory  distress.  Coronary artery disease.  EXAM: PORTABLE CHEST - 1 VIEW  COMPARISON:  CT ANGIO CHEST W/CM &/OR WO/CM dated 09/24/2013; DG CHEST 2 VIEW dated 09/12/2013  FINDINGS: Pacer/AICD device. Right atrium, right ventricle, and coronary sinus.  Remote right rib trauma. Midline trachea. Cardiomegaly accentuated by AP portable technique. Small right greater than left pleural effusions which are increased since 09/23/2013. No pneumothorax. Interstitial edema which is moderate. Persistent bibasilar airspace disease since 1 day prior.  IMPRESSION: Since 09/09/2013, increased moderate congestive heart failure.  Increased bilateral pleural effusions.  Persistent bibasilar atelectasis versus infection.   Electronically Signed   By: Abigail Miyamoto M.D.   On: 09/25/2013 17:39   CXR: 1/18 >>> ETT in place, RIJ CVL seen in mid/distal SVC. B/l pleural effusions w/ b/l airspace opacification, somewhat worse than previous.  ASSESSMENT / PLAN:  PULMONARY A: CAP vs viral pneumonitis LUL pulmonary nodule w/ mediastinal lymphadenopathy Bronchiectasis S/p intubation P:   -Continue full vent support. Decrease FiO2 as tolerated. -Goal pH >7.30, SpO2 >92  -VAP bundle -Daily SBT's -Continue Abx (as below)  -Added Solu-Cortef 50 mg q6h  CARDIOVASCULAR A:  H/o CAD, Takotsubo CMP. EF 50-55% on ECHO H/o AV block, s/p PPM placement Hypotension likely 2/2 septic shock d/t CAP P:  -Continue Levophed + Vasopressin.  -Solu-Cortef 50 mg q6h -NS bolus to achieve goal CVP of 12-15. -Hold Coreg for now  -Continue ASA, Lipitor  RENAL A:   AKI, likely pre-renal Hyponatremia. P:   -Trend BMP  GASTROINTESTINAL A:   GI PPx. Nutrition.  P:   -NPO for now. Start tube feeds today. -Protonix 40 qd  HEMATOLOGIC A:   DVT PPx. Increased leukocytosis. P:  -Lovenox -Trend CBC -ABx as below  INFECTIOUS A:   CAP vs viral pneumonitis Septic shock; resolving Worsened leukocytosis P:   -Continue  Vancomycin/Zosyn -D/c Tamiflu -Pressors as above; wean as appropriate -Check Procalcitonin -Respiratory culture  ENDOCRINE A:   No h/o DM Normoglycemic P:   -Add ISS given tube feeds and steroids -CBG's q4h  NEUROLOGIC A:   Sedated H/o depression/anxiety P:   -Continue Fentanyl gtt -Continue Versed gtt, wean as tolerated. -Hold Effexor for now  Signed: Luanne Bras, MD 09/26/2013 6:43 AM   PCCM ATTENDING: I have interviewed and examined the patient and reviewed the database. I have formulated the assessment and plan as reflected in the note above with amendments made by me. 35 mins of direct critical care time provided  Merton Border, MD;  PCCM service; Mobile [phone]

## 2013-09-26 NOTE — Progress Notes (Signed)
Pt rec'd 5 liters of NS per CCM earlier order to achieve CVP of 12. After 4 Liters I called Johnnette Gourd to see if I should continue with the boluses because the CVP was only 8.  He said to continue.  I realized the documentation was not being captured on the [facility] so I called pharmacy to try and fix this.  They had me contact the MD to explain this.  CCM had me to stop with the boluses after the fifth one.  The way this is documented is after the order ended but I wanted to reflect how much the patient actually got.

## 2013-09-26 NOTE — Progress Notes (Signed)
PT Cancellation Note  Patient Details Name: JOYANNA KLEMAN MRN: [id number] DOB: [birth year]   Cancelled Treatment:    Reason Eval/Treat Not Completed: Medical issues which prohibited therapy.  Intubated last night. 09/26/2013  Donnella Sham, [place] [phone]  (pager)  Sabre Romberger, Tessie Fass 09/26/2013, 3:53 PM

## 2013-09-26 NOTE — Progress Notes (Signed)
RT note- bite block removed

## 2013-09-26 NOTE — Progress Notes (Signed)
ANTIBIOTIC CONSULT NOTE - Follow-up  Pharmacy Consult for Vancomycin and Zosyn Indication: pneumonia  Allergies  Allergen Reactions  . Codeine Other (See Comments)    Unsure of reaction    Patient Measurements: Height: 5\' 4"  (162.6 cm) Weight: 144 lb 10 oz (65.6 kg) IBW/kg (Calculated) : 54.7  Vital Signs: Temp: 98 F (36.7 C) (01/19 1200) Temp src: Oral (01/19 1200) BP: 139/59 mmHg (01/19 1300) Pulse Rate: 87 (01/19 1300) Intake/Output from previous day: 01/18 0701 - 01/19 0700 In: 3444.6 [P.O.:90; I.V.:354.6; IV Piggyback:3000] Out: 520 [Urine:520] Intake/Output from this shift: Total I/O In: 259.3 [I.V.:259.3] Out: 70 [Urine:70]  Labs:  Recent Labs  09/24/13 0421 09/25/13 0536 09/25/13 1730 09/26/13 0400  WBC 15.8*  --  17.0* 23.2*  HGB 13.2  --  13.1 13.1  PLT 446*  --  415* 340  CREATININE 0.61 0.61 0.57 1.33*   Estimated Creatinine Clearance: 27.2 ml/min (by C-G formula based on Cr of 1.33). No results found for this basename: VANCOTROUGH, Corlis Leak, VANCORANDOM, [place], [place], [place], [place], TOBRAPEAK, TOBRARND, AMIKACINPEAK, AMIKACINTROU, AMIKACIN,  in the last 72 hours   Microbiology: Recent Results (from the past 720 hour(s))  URINE CULTURE     Status: None   Collection Time    09/27/2013 11:05 PM      Result Value Range Status   Specimen Description URINE, CLEAN CATCH   Final   Special Requests NONE   Final   Culture  Setup Time     Final   Value: 09/21/2013 06:13     Performed at [place]     Final   Value: 10,000 COLONIES/ML     Performed at [facility]   Culture     Final   Value: PSEUDOMONAS AERUGINOSA     Performed at [facility]   Report Status 09/24/2013 FINAL   Final   Organism ID, Bacteria PSEUDOMONAS AERUGINOSA   Final  CULTURE, BLOOD (ROUTINE X 2)     Status: None   Collection Time    09/21/13 12:15 AM      Result Value Range Status   Specimen Description BLOOD LEFT  ANTECUBITAL   Final   Special Requests     Final   Value: BOTTLES DRAWN AEROBIC AND ANAEROBIC AER 5cc ANA 5cc   Culture  Setup Time     Final   Value: 09/21/2013 02:20     Performed at [facility]   Culture     Final   Value:        BLOOD CULTURE RECEIVED NO GROWTH TO DATE CULTURE WILL BE HELD FOR 5 DAYS BEFORE ISSUING A FINAL NEGATIVE REPORT     Performed at [facility]   Report Status PENDING   Incomplete  CULTURE, BLOOD (ROUTINE X 2)     Status: None   Collection Time    09/21/13 12:15 AM      Result Value Range Status   Specimen Description BLOOD RIGHT ANTECUBITAL   Final   Special Requests     Final   Value: BOTTLES DRAWN AEROBIC AND ANAEROBIC AER 5cc ANA 5cc   Culture  Setup Time     Final   Value: 09/21/2013 02:20     Performed at [facility]   Culture     Final   Value:        BLOOD CULTURE RECEIVED NO GROWTH TO DATE CULTURE WILL BE HELD FOR 5 DAYS BEFORE ISSUING A  FINAL NEGATIVE REPORT     Performed at [facility]   Report Status PENDING   Incomplete  CULTURE, BLOOD (ROUTINE X 2)     Status: None   Collection Time    09/21/13  6:10 AM      Result Value Range Status   Specimen Description BLOOD RIGHT ARM   Final   Special Requests BOTTLES DRAWN AEROBIC AND ANAEROBIC 10CC EACH   Final   Culture  Setup Time     Final   Value: 09/21/2013 12:14     Performed at [facility]   Culture     Final   Value:        BLOOD CULTURE RECEIVED NO GROWTH TO DATE CULTURE WILL BE HELD FOR 5 DAYS BEFORE ISSUING A FINAL NEGATIVE REPORT     Performed at [facility]   Report Status PENDING   Incomplete  CULTURE, BLOOD (ROUTINE X 2)     Status: None   Collection Time    09/21/13  6:18 AM      Result Value Range Status   Specimen Description BLOOD RIGHT ARM   Final   Special Requests BOTTLES DRAWN AEROBIC AND ANAEROBIC 10CC EACH   Final   Culture  Setup Time     Final   Value: 09/21/2013 12:14     Performed at [facility]    Culture     Final   Value:        BLOOD CULTURE RECEIVED NO GROWTH TO DATE CULTURE WILL BE HELD FOR 5 DAYS BEFORE ISSUING A FINAL NEGATIVE REPORT     Performed at [facility]   Report Status PENDING   Incomplete  MRSA PCR SCREENING     Status: None   Collection Time    09/25/13  7:29 PM      Result Value Range Status   MRSA by PCR NEGATIVE  NEGATIVE Final   Comment:            The GeneXpert MRSA Assay (FDA     approved for NASAL specimens     only), is one component of a     comprehensive MRSA colonization     surveillance program. It is not     intended to diagnose MRSA     infection nor to guide or     monitor treatment for     MRSA infections.    Assessment  80 yr female admitted 1/14 with cough.  Required transfer to ICU 1/18 with worsening hypoxemia. Pt now on Vancomycin and Zosyn Day#2 for PNA. Tm 102.4. Tc 98.5. Wbc up to 23.2.   1/18 Vanc>> 1/18 Zosyn>> 1/14 Tamiflu>>1/19 1/16 levaquin>>1/18 1/14 Roceph>>1/15 1/14 Azith>>1/15  1/19 Bld x2>> 1/13 Urine>> pseudomonas (pan S) 1/14 Bld x 4>>ngtd 1/14 Flu neg  Nephrology: SCr up to 1.33, est CrCl 27. UOP 0.3. Hyponatremia.  Goal of Therapy:  Vancomycin trough level 15-20 mcg/ml  Plan:  1. Change Vancomycin to 750 mg IV q 24 hrs. 2. Continue Zosyn 3.375g IV q 8 hrs (extended-interval 4 hr infusion). 3. Will monitor clinical course, cultures, and renal function. 4. Change Lovenox to 30mg  SQ q24h with CrCl < 30 ml/min.  Sherlon Handing, PharmD, BCPS Clinical pharmacist, pager [phone]  09/26/2013 1:29 PM

## 2013-09-26 NOTE — Progress Notes (Signed)
[place] Progress Note Patient Name: BLAYNE GARLICK DOB: 1927/02/28 MRN: [id number]  Date of Service  09/26/2013   HPI/Events of Note  Best Practice   eICU Interventions  Protonix for stress ulcer propy while vented   Intervention Category Intermediate Interventions: Best-practice therapies (e.g. DVT, beta blocker, etc.)  Fifi Schindler 09/26/2013, 5:06 AM

## 2013-09-26 NOTE — Procedures (Signed)
Central Venous Catheter Insertion Procedure Note  [name] [id number] 09/26/2013  Procedure: Insertion of Central Venous Catheter   Indications: Drug and/or fluid administration   Procedure Details  Consent: Risks of procedure as well as the alternatives and risks of each were explained to the (patient/caregiver). Consent for procedure obtained.   Time Out: Verified patient identification, verified procedure, site/side was marked, verified correct patient position, special equipment/implants available, medications/allergies/relevent history reviewed, required imaging and test results available. Performed   Maximum sterile technique was used including antiseptics, cap, gloves, gown, hand hygiene, mask and sheet.  Skin prep: Chlorhexidine; local anesthetic administered  An antimicrobial bonded/coated triple lumen catheter was placed in the R IJ vein, and sutured in place, with overlying bandage placed.  Evaluation  Blood flow good  Complications: No apparent complications  Patient did tolerate procedure well.  Chest X-ray ordered to verify placement. CXR: good placement  Signed, Elnora Morrison, MD PGY-3, Internal Medicine Pgr: [phone] 09/26/2013, 12:16 AM  I was present for the entire procedure.  Waynetta Pean, M.D. Pulmonary and [place] Pager: [phone]

## 2013-09-27 ENCOUNTER — Inpatient Hospital Stay (HOSPITAL_COMMUNITY): Payer: Medicare HMO

## 2013-09-27 LAB — CULTURE, BLOOD (ROUTINE X 2)
CULTURE: NO GROWTH
Culture: NO GROWTH
Culture: NO GROWTH
Culture: NO GROWTH

## 2013-09-27 LAB — PROCALCITONIN: Procalcitonin: 2.58 ng/mL

## 2013-09-27 LAB — BASIC METABOLIC PANEL
BUN: 19 mg/dL (ref 6–23)
CHLORIDE: 103 meq/L (ref 96–112)
CO2: 18 meq/L — AB (ref 19–32)
Calcium: 7.5 mg/dL — ABNORMAL LOW (ref 8.4–10.5)
Creatinine, Ser: 0.8 mg/dL (ref 0.50–1.10)
GFR calc Af Amer: 76 mL/min — ABNORMAL LOW (ref >90)
GFR calc non Af Amer: 66 mL/min — ABNORMAL LOW (ref >90)
Glucose, Bld: 217 mg/dL — ABNORMAL HIGH (ref 70–99)
Potassium: 3.8 mEq/L (ref 3.7–5.3)
Sodium: 133 mEq/L — ABNORMAL LOW (ref 137–147)

## 2013-09-27 LAB — GLUCOSE, CAPILLARY
GLUCOSE-CAPILLARY: 184 mg/dL — AB (ref 70–99)
GLUCOSE-CAPILLARY: 192 mg/dL — AB (ref 70–99)
Glucose-Capillary: 182 mg/dL — ABNORMAL HIGH (ref 70–99)
Glucose-Capillary: 186 mg/dL — ABNORMAL HIGH (ref 70–99)
Glucose-Capillary: 187 mg/dL — ABNORMAL HIGH (ref 70–99)
Glucose-Capillary: 209 mg/dL — ABNORMAL HIGH (ref 70–99)

## 2013-09-27 LAB — CBC
HEMATOCRIT: 30.2 % — AB (ref 36.0–46.0)
Hemoglobin: 10.1 g/dL — ABNORMAL LOW (ref 12.0–15.0)
MCH: 32 pg (ref 26.0–34.0)
MCHC: 33.4 g/dL (ref 30.0–36.0)
MCV: 95.6 fL (ref 78.0–100.0)
Platelets: 287 10*3/uL (ref 150–400)
RBC: 3.16 MIL/uL — ABNORMAL LOW (ref 3.87–5.11)
RDW: 13.7 % (ref 11.5–15.5)
WBC: 19.3 10*3/uL — ABNORMAL HIGH (ref 4.0–10.5)

## 2013-09-27 LAB — IGG, IGA, IGM
IgA: 456 mg/dL — ABNORMAL HIGH (ref 69–380)
IgG (Immunoglobin G), Serum: 979 mg/dL (ref 690–1700)
IgM, Serum: 75 mg/dL (ref 52–322)

## 2013-09-27 MED ORDER — PANTOPRAZOLE SODIUM 40 MG PO PACK
40.0000 mg | PACK | Freq: Every day | ORAL | Status: DC
  Administered 2013-09-28 – 2013-10-04 (×7): 40 mg
  Filled 2013-09-27 (×7): qty 20

## 2013-09-27 MED ORDER — ENOXAPARIN SODIUM 40 MG/0.4ML ~~LOC~~ SOLN
40.0000 mg | Freq: Every day | SUBCUTANEOUS | Status: DC
  Administered 2013-09-28 – 2013-10-03 (×6): 40 mg via SUBCUTANEOUS
  Filled 2013-09-27 (×7): qty 0.4

## 2013-09-27 MED ORDER — HYDROCORTISONE SOD SUCCINATE 100 MG IJ SOLR
50.0000 mg | Freq: Two times a day (BID) | INTRAMUSCULAR | Status: DC
  Administered 2013-09-27 – 2013-09-28 (×2): 50 mg via INTRAVENOUS
  Filled 2013-09-27 (×4): qty 1

## 2013-09-27 MED ORDER — GABAPENTIN 250 MG/5ML PO SOLN
200.0000 mg | Freq: Three times a day (TID) | ORAL | Status: DC
  Administered 2013-09-27 – 2013-10-04 (×21): 200 mg via ORAL
  Filled 2013-09-27 (×24): qty 4

## 2013-09-27 MED ORDER — WHITE PETROLATUM GEL
Status: AC
  Administered 2013-09-27: 17:00:00
  Filled 2013-09-27: qty 5

## 2013-09-27 MED ORDER — ASPIRIN 81 MG PO CHEW
81.0000 mg | CHEWABLE_TABLET | Freq: Every day | ORAL | Status: DC
  Administered 2013-09-27 – 2013-10-04 (×8): 81 mg via ORAL
  Filled 2013-09-27 (×9): qty 1

## 2013-09-27 MED ORDER — VENLAFAXINE HCL 75 MG PO TABS
75.0000 mg | ORAL_TABLET | Freq: Two times a day (BID) | ORAL | Status: DC
  Administered 2013-09-27 – 2013-09-28 (×2): 75 mg via ORAL
  Filled 2013-09-27 (×5): qty 1

## 2013-09-27 NOTE — Progress Notes (Signed)
Name: [name] MRN: [id number] DOB: 1926/10/29    ADMISSION DATE:  09/25/2013 CONSULTATION DATE:  09/25/13  REFERRING MD :  Grandville Silos PRIMARY SERVICE: [facility]  CHIEF COMPLAINT: Abnormal CT, pneumonia, hypoxia  BRIEF PATIENT DESCRIPTION: 80 y/o female never smoker with PMHx of CAD, takotsubo cardiomyopathy, HTN, AV block s/p PPM placement, presented 1/14 with 4 day history of cough, congestion, fever, malaise. She was admitted and also found to have UTI. Treated initially with rocephin, azithro and empiric tamiflu (although flu neg), switched to Levaquin. Patient continued to have hypoxia, CT chest showed LUL pulm nodule and PCCM consulted.   SIGNIFICANT EVENTS / STUDIES:  CT chest 1/17 >>> No PE, peribronchovascular soft tissue thickening extending from the left and right hilum into the lower lobes, Left upper lobe pulmonary nodule and mediastinal lymphadenopathy, BLL consolidation.    LINES / TUBES: ETT 1/19 >>> RIJ CVL 1/19 >>>  CULTURES: Urine 1/13>>> psuedomonas (pan sens)  BCx2 1/14>>>  Urine legionella 1/14>>> NEG  Urine strep 1/14>>> NEG  Flu PCR 1/14>> NEG  MRSA 1/18 >>> NEG Blood 1/19 >>> Resp 1/19 >>>  ANTIBIOTICS: Azithro 1/14 >>> 1/14 Rocephin 1/14 >>> 1/14 Levaquin 1/16 >>>1/18 Zosyn 1/18 >>> Vancomycin 1/18 >>> Tamiflu 1/15 >>> 1/19  HISTORY OF PRESENT ILLNESS:  Vasopressin/Levophed off. Leukocytosis trending down. Cr significantly improved.   SUBJECTIVE:   VITAL SIGNS: Temp:  [97.6 F (36.4 C)-99 F (37.2 C)] 97.6 F (36.4 C) (01/20 0408) Pulse Rate:  [79-108] 87 (01/20 0600) Resp:  [0-27] 20 (01/20 0600) BP: (77-157)/(45-80) 94/45 mmHg (01/20 0600) SpO2:  [90 %-98 %] 91 % (01/20 0600) Arterial Line BP: (77-162)/(39-70) 80/63 mmHg (01/20 0600) FiO2 (%):  [50 %-80 %] 50 % (01/20 0310)  HEMODYNAMICS: CVP:  [6 mmHg-10 mmHg] 8 mmHg  VENTILATOR SETTINGS: Vent Mode:  [-] PRVC FiO2 (%):  [50 %-80 %] 50 % Set Rate:  [20 bmp] 20 bmp Vt  Set:  [430 mL] 430 mL PEEP:  [8 cmH20-12 cmH20] 8 cmH20 Plateau Pressure:  [17 cmH20-24 cmH20] 18 cmH20  INTAKE / OUTPUT: Intake/Output     01/19 0701 - 01/20 0700   I.V. (mL/kg) 520.6 (7.9)   NG/GT 50   IV Piggyback 350   Total Intake(mL/kg) 920.6 (14)   Urine (mL/kg/hr) 470 (0.3)   Total Output 470   Net +450.6         PHYSICAL EXAMINATION:  General: Frail elderly female, NAD.  Neuro: Sedated. RASS -1. HEENT: Mm moist, no JVD. ETT/OGT in place.  Cardiovascular: s1s2 rrr  Lungs: Air entry equal bilaterally. Coarse breath sounds diffusely. No wheezes.  Abdomen: Soft, +bs  Musculoskeletal: Warm and dry, no edema  LABS:  CBC  Recent Labs Lab 09/24/13 0421 09/25/13 1730 09/26/13 0400  WBC 15.8* 17.0* 23.2*  HGB 13.2 13.1 13.1  HCT 39.0 38.0 38.1  PLT 446* 415* 340   Coag's  Recent Labs Lab 09/26/2013 2135  INR 1.11   BMET  Recent Labs Lab 09/25/13 0536 09/25/13 1730 09/26/13 0400  NA 128* 127* 128*  K 4.0 4.3 4.8  CL 91* 89* 94*  CO2 23 22 17*  BUN 7 6 11   CREATININE 0.61 0.57 1.33*  GLUCOSE 179* 137* 231*   Electrolytes  Recent Labs Lab 09/25/13 0536 09/25/13 1730 09/26/13 0400  CALCIUM 9.1 8.9 8.1*   Sepsis Markers  Recent Labs Lab 09/25/2013 2159 09/26/13 1105  LATICACIDVEN 1.97  --   PROCALCITON  --  3.42   ABG  Recent  Labs Lab 09/25/13 2130 09/26/13 0025 09/26/13 1809  PHART 7.448 7.317* 7.327*  PCO2ART 35.8 38.8 31.7*  PO2ART 71.4* 104.0* 73.0*   Liver Enzymes  Recent Labs Lab 10/02/2013 2135 09/25/13 1730  AST 57* 56*  ALT 26 37*  ALKPHOS 74 78  BILITOT 0.4 0.4  ALBUMIN 3.1* 2.3*   Cardiac Enzymes  Recent Labs Lab 09/22/2013 2317 09/21/13 0909 09/21/13 1404 09/21/13 2011 09/25/13 1719  TROPONINI <0.30 <0.30 <0.30 <0.30  --   PROBNP 818.6*  --   --   --  1223.0*   Glucose  Recent Labs Lab 09/26/13 0730 09/26/13 1159 09/26/13 1510 09/26/13 1927 09/26/13 2355 09/27/13 0405  GLUCAP 220* 185* 187* 179*  186* 187*    Imaging Dg Chest Port 1 View  09/26/2013   CLINICAL DATA:  Endotracheal tube placement. Central line placement.  EXAM: PORTABLE CHEST - 1 VIEW  COMPARISON:  Chest radiograph performed earlier today at 5:32 p.m.  FINDINGS: The patient's endotracheal tube is seen ending 4 cm above the carina. A right IJ line is noted ending about the mid to distal SVC. The enteric tube is seen extending below the diaphragm.  Persistent small bilateral pleural effusions are seen, mildly improved on the right, with diffuse bilateral airspace opacification, perhaps mildly worsened. Findings are most compatible with pulmonary edema, though superimposed pneumonia cannot be excluded. No pneumothorax is seen.  The cardiomediastinal silhouette remains normal in size. A pacemaker is noted overlying the left chest wall, with leads ending overlying the right atrium, right ventricle and coronary sinus. No acute osseous abnormalities are seen. Chronic bilateral rib deformities are again noted.  IMPRESSION: 1. Endotracheal tube seen ending 4 cm above the carina. 2. Right IJ line seen ending about the mid to distal SVC. 3. Persistent small bilateral pleural effusions, mildly improved on the right. Diffuse bilateral airspace opacification is perhaps mildly worsened. Findings are most compatible with pulmonary edema, though superimposed pneumonia cannot be excluded.   Electronically Signed   By: Garald Balding M.D.   On: 09/26/2013 00:01   Dg Chest Port 1 View  09/25/2013   CLINICAL DATA:  Respiratory distress.  Coronary artery disease.  EXAM: PORTABLE CHEST - 1 VIEW  COMPARISON:  CT ANGIO CHEST W/CM &/OR WO/CM dated 09/24/2013; DG CHEST 2 VIEW dated 09/24/2013  FINDINGS: Pacer/AICD device. Right atrium, right ventricle, and coronary sinus.  Remote right rib trauma. Midline trachea. Cardiomegaly accentuated by AP portable technique. Small right greater than left pleural effusions which are increased since 09/25/2013. No  pneumothorax. Interstitial edema which is moderate. Persistent bibasilar airspace disease since 1 day prior.  IMPRESSION: Since 09/22/2013, increased moderate congestive heart failure.  Increased bilateral pleural effusions.  Persistent bibasilar atelectasis versus infection.   Electronically Signed   By: Abigail Miyamoto M.D.   On: 09/25/2013 17:39   CXR: 1/19 >>> ETT in place, RIJ CVL seen in mid/distal SVC. Somewhat worsened b/l pleural effusions (likely related to change in position) w/ b/l airspace opacification.  ASSESSMENT / PLAN:  PULMONARY A: CAP vs viral pneumonitis LUL pulmonary nodule w/ mediastinal lymphadenopathy P:   -Continue full vent support. Decrease FiO2 as tolerated. -Goal pH >7.30, SpO2 >92  -VAP bundle -Daily SBT's -Continue Abx (as below)  -Continue Solu-Cortef 50 mg q12h  CARDIOVASCULAR A:  H/o CAD, Takotsubo CMP. EF 50-55% on ECHO H/o AV block, s/p PPM placement Hypotension likely 2/2 septic shock d/t CAP. Improved. P:   -Solu-Cortef 50 mg q6h -Hold Coreg for now  -Continue ASA,  Lipitor  RENAL A:   AKI, likely pre-renal. Resolved.  Hyponatremia. Improving. P:   -Trend BMP  GASTROINTESTINAL A:   GI PPx. Nutrition.  P:   -Continue tube feeds -Protonix 40 qd  HEMATOLOGIC A:   DVT PPx. Leukocytosis. Improving. P:  -Lovenox -Trend CBC -ABx as below  INFECTIOUS A:   CAP vs viral pneumonitis. Procalcitonin 3.42 Septic shock; resolving P:   -Continue Vancomycin/Zosyn -Follow Procalcitonin -Respiratory culture pending  ENDOCRINE A:   No h/o DM Normoglycemic P:   -ISS -CBG's q4h  NEUROLOGIC A:   Sedated H/o depression/anxiety P:   -Continue Fentanyl gtt -Effexor -Neurontin  Signed: Luanne Bras, MD 09/27/2013 6:27 AM  PCCM ATTENDING: I have interviewed and examined the patient and reviewed the database. I have formulated the assessment and plan as reflected in the note above with amendments made by me. 30 mins of direct  critical care time provided. Will perform FOB 1/21 for airway exam  Merton Border, MD;  PCCM service; Mobile [phone]

## 2013-09-28 ENCOUNTER — Encounter (HOSPITAL_COMMUNITY): Payer: Medicare HMO

## 2013-09-28 ENCOUNTER — Inpatient Hospital Stay (HOSPITAL_COMMUNITY): Payer: Medicare HMO

## 2013-09-28 LAB — COMPREHENSIVE METABOLIC PANEL
ALT: 42 U/L — ABNORMAL HIGH (ref 0–35)
AST: 46 U/L — AB (ref 0–37)
Albumin: 1.5 g/dL — ABNORMAL LOW (ref 3.5–5.2)
Alkaline Phosphatase: 68 U/L (ref 39–117)
BILIRUBIN TOTAL: 0.2 mg/dL — AB (ref 0.3–1.2)
BUN: 24 mg/dL — AB (ref 6–23)
CALCIUM: 8.3 mg/dL — AB (ref 8.4–10.5)
CO2: 21 mEq/L (ref 19–32)
Chloride: 104 mEq/L (ref 96–112)
Creatinine, Ser: 0.66 mg/dL (ref 0.50–1.10)
GFR calc Af Amer: >90 mL/min (ref >90)
GFR, EST NON AFRICAN AMERICAN: 79 mL/min — AB (ref >90)
Glucose, Bld: 196 mg/dL — ABNORMAL HIGH (ref 70–99)
Potassium: 4.2 mEq/L (ref 3.7–5.3)
Sodium: 135 mEq/L — ABNORMAL LOW (ref 137–147)
Total Protein: 5 g/dL — ABNORMAL LOW (ref 6.0–8.3)

## 2013-09-28 LAB — CBC
HEMATOCRIT: 27.7 % — AB (ref 36.0–46.0)
Hemoglobin: 9.1 g/dL — ABNORMAL LOW (ref 12.0–15.0)
MCH: 31.9 pg (ref 26.0–34.0)
MCHC: 32.9 g/dL (ref 30.0–36.0)
MCV: 97.2 fL (ref 78.0–100.0)
PLATELETS: 293 10*3/uL (ref 150–400)
RBC: 2.85 MIL/uL — ABNORMAL LOW (ref 3.87–5.11)
RDW: 14.1 % (ref 11.5–15.5)
WBC: 20.3 10*3/uL — ABNORMAL HIGH (ref 4.0–10.5)

## 2013-09-28 LAB — GLUCOSE, CAPILLARY
GLUCOSE-CAPILLARY: 231 mg/dL — AB (ref 70–99)
GLUCOSE-CAPILLARY: 156 mg/dL — AB (ref 70–99)
Glucose-Capillary: 189 mg/dL — ABNORMAL HIGH (ref 70–99)
Glucose-Capillary: 165 mg/dL — ABNORMAL HIGH (ref 70–99)
Glucose-Capillary: 160 mg/dL — ABNORMAL HIGH (ref 70–99)
Glucose-Capillary: 164 mg/dL — ABNORMAL HIGH (ref 70–99)

## 2013-09-28 LAB — PROCALCITONIN: Procalcitonin: 1.7 ng/mL

## 2013-09-28 MED ORDER — LIDOCAINE HCL (PF) 2 % IJ SOLN
10.0000 mL | Freq: Once | INTRAMUSCULAR | Status: DC
  Filled 2013-09-28: qty 10

## 2013-09-28 MED ORDER — VANCOMYCIN HCL IN DEXTROSE 750-5 MG/150ML-% IV SOLN
750.0000 mg | Freq: Two times a day (BID) | INTRAVENOUS | Status: DC
  Administered 2013-09-28 – 2013-09-30 (×5): 750 mg via INTRAVENOUS
  Filled 2013-09-28 (×6): qty 150

## 2013-09-28 MED ORDER — MIDAZOLAM HCL 2 MG/2ML IJ SOLN
1.0000 mg | INTRAMUSCULAR | Status: DC | PRN
  Administered 2013-09-30 – 2013-10-03 (×14): 2 mg via INTRAVENOUS
  Filled 2013-09-28 (×14): qty 2

## 2013-09-28 MED ORDER — MIDAZOLAM HCL 2 MG/2ML IJ SOLN
INTRAMUSCULAR | Status: AC
  Administered 2013-09-28: 4 mg
  Filled 2013-09-28: qty 4

## 2013-09-28 MED ORDER — LIDOCAINE HCL (PF) 1 % IJ SOLN
INTRAMUSCULAR | Status: AC
  Filled 2013-09-28: qty 5

## 2013-09-28 MED ORDER — POTASSIUM CHLORIDE 20 MEQ/15ML (10%) PO LIQD
20.0000 meq | Freq: Once | ORAL | Status: AC
  Administered 2013-09-28: 20 meq
  Filled 2013-09-28: qty 15

## 2013-09-28 MED ORDER — HYDROCORTISONE SOD SUCCINATE 100 MG IJ SOLR
25.0000 mg | Freq: Two times a day (BID) | INTRAMUSCULAR | Status: DC
  Administered 2013-09-28 – 2013-09-29 (×2): 25 mg via INTRAVENOUS
  Filled 2013-09-28 (×4): qty 0.5

## 2013-09-28 MED ORDER — FUROSEMIDE 10 MG/ML IJ SOLN
40.0000 mg | Freq: Once | INTRAMUSCULAR | Status: AC
  Administered 2013-09-28: 40 mg via INTRAVENOUS

## 2013-09-28 MED ORDER — POLYETHYLENE GLYCOL 3350 17 G PO PACK
17.0000 g | PACK | Freq: Every day | ORAL | Status: DC
  Administered 2013-09-29 – 2013-10-02 (×4): 17 g
  Filled 2013-09-28 (×6): qty 1

## 2013-09-28 MED ORDER — LIDOCAINE HCL (PF) 1 % IJ SOLN
INTRAMUSCULAR | Status: AC
  Administered 2013-09-28: 2 mL
  Filled 2013-09-28: qty 15

## 2013-09-28 NOTE — Progress Notes (Signed)
ETT secure no skin break down noted. Deep oral suction done.

## 2013-09-28 NOTE — Procedures (Signed)
Indication:   Abnormality on CT chest  Premedication:  Fentanyl infusion  Sedation:  Midaz 4 mg IV  Anesthesia: 10 cc 1% lidocaine  Procedure: After adequate sedation and anesthesia, the bronchoscope was introduced via the ETT. Airway anesthesia was achieved with 10 cc of 1% lidocaine and the scope was advanced into the trachea and bronchial airways. This revealed the following.  Findings:  Severe erythema and edema of bronchus intermedius, RLL, RML airways  Specimens:   [place] RML sent for bacterial culture, AFB smear and culture and cytology  Post procedure evaluation:  The patient tolerated the procedure well with no major complications    Merton Border, MD;  PCCM service; Mobile [phone]

## 2013-09-28 NOTE — Progress Notes (Addendum)
PULMONARY/CRITICAL CARE MEDICINE   Name: [name] MRN: [id number] DOB: 13-Jun-1927    ADMISSION DATE:  09/25/2013 CONSULTATION DATE:  09/25/13  REFERRING MD :  Grand[place] Silos PRIMARY SERVICE: [facility]  CHIEF COMPLAINT: Abnormal CT, pneumonia, hypoxia  BRIEF PATIENT DESCRIPTION: 80 y/o female never smoker with PMHx of CAD, takotsubo cardiomyopathy, HTN, AV block s/p PPM placement, presented 1/14 with 4 day history of cough, congestion, fever, malaise. She was admitted and also found to have UTI. Treated initially with rocephin, azithro and empiric tamiflu (although flu neg), switched to Levaquin. Patient continued to have hypoxia, CT chest showed LUL pulm nodule and PCCM consulted.   SIGNIFICANT EVENTS / STUDIES:  1/17 CT chest:  No PE, peribronchovascular soft tissue thickening extending from the left and right hilum into the lower lobes, Left upper lobe pulmonary nodule and mediastinal lymphadenopathy, BLL consolidation.  1/21 FOB: No masses noted. BI, RLL, RML airways markedly edematous and erythematous. Resp cx, AFB, cytology on washings   LINES / TUBES: ETT 1/19 >>> RIJ CVL 1/19 >>>   CULTURES: Urine 1/13>>> psuedomonas (pan sens)  BCx2 1/14>>> Neg Urine legionella 1/14>>> NEG  Urine strep 1/14>>> NEG  Flu PCR 1/14>> NEG  MRSA 1/18 >>> NEG Blood 1/19 >>  Resp 1/19 >>  Resp (bronch) 1/21 >>  AFB (bronch) 1/21 >>   ANTIBIOTICS: Azithro 1/14 >>> 1/14 Rocephin 1/14 >>> 1/14 Levaquin 1/16 >>>1/18 Zosyn 1/18 >>> Vancomycin 1/18 >>> Tamiflu 1/15 >>> 1/19   SUBJECTIVE: Sedated, no acute events overnight.  VITAL SIGNS: Temp:  [97.5 F (36.4 C)-98.2 F (36.8 C)] 98.1 F (36.7 C) (01/21 0430) Pulse Rate:  [69-95] 88 (01/21 0800) Resp:  [18-24] 20 (01/21 0700) BP: (93-130)/(47-74) 119/66 mmHg (01/21 0800) SpO2:  [89 %-95 %] 91 % (01/21 0800) Arterial Line BP: (61-70)/(48-59) 62/48 mmHg (01/20 1000) FiO2 (%):  [50 %] 50 % (01/21 0800)  HEMODYNAMICS:    VENTILATOR  SETTINGS: Vent Mode:  [-] PRVC FiO2 (%):  [50 %] 50 % Set Rate:  [20 bmp] 20 bmp Vt Set:  [430 mL] 430 mL PEEP:  [8 cmH20] 8 cmH20 Plateau Pressure:  [16 cmH20-20 cmH20] 20 cmH20  INTAKE / OUTPUT: Intake/Output     01/20 0701 - 01/21 0700 01/21 0701 - 01/22 0700   I.V. (mL/kg) 120 (1.6)    NG/GT 450    IV Piggyback 300    Total Intake(mL/kg) 870 (11.9)    Urine (mL/kg/hr) 1535 (0.9)    Total Output 1535     Net -665            PHYSICAL EXAMINATION:  General: Sedated on mechanical ventalation, NAD.  Neuro: Sedated. RASS -2. HEENT: ETT/OGT in place.  Cardiovascular: RRR, no murmur Lungs: Coarse breath sounds B/L, no wheezing Abdomen: Soft, +bs  Musculoskeletal: Warm and dry, no edema  LABS:  CBC  Recent Labs Lab 09/26/13 0400 09/27/13 0500 09/28/13 0500  WBC 23.2* 19.3* 20.3*  HGB 13.1 10.1* 9.1*  HCT 38.1 30.2* 27.7*  PLT 340 287 293   Coag's No results found for this basename: APTT, INR,  in the last 168 hours BMET  Recent Labs Lab 09/26/13 0400 09/27/13 0500 09/28/13 0500  NA 128* 133* 135*  K 4.8 3.8 4.2  CL 94* 103 104  CO2 17* 18* 21  BUN 11 19 24*  CREATININE 1.33* 0.80 0.66  GLUCOSE 231* 217* 196*   Electrolytes  Recent Labs Lab 09/26/13 0400 09/27/13 0500 09/28/13 0500  CALCIUM 8.1* 7.5* 8.3*  Sepsis Markers  Recent Labs Lab 09/26/13 1105 09/27/13 0500 09/28/13 0500  PROCALCITON 3.42 2.58 1.70   ABG  Recent Labs Lab 09/25/13 2130 09/26/13 0025 09/26/13 1809  PHART 7.448 7.317* 7.327*  PCO2ART 35.8 38.8 31.7*  PO2ART 71.4* 104.0* 73.0*   Liver Enzymes  Recent Labs Lab 09/25/13 1730 09/28/13 0500  AST 56* 46*  ALT 37* 42*  ALKPHOS 78 68  BILITOT 0.4 0.2*  ALBUMIN 2.3* 1.5*   Cardiac Enzymes  Recent Labs Lab 09/21/13 0909 09/21/13 1404 09/21/13 2011 09/25/13 1719  TROPONINI <0.30 <0.30 <0.30  --   PROBNP  --   --   --  1223.0*   Glucose  Recent Labs Lab 09/27/13 0730 09/27/13 1131 09/27/13 1543  09/27/13 1859 09/27/13 2323 09/28/13 0351  GLUCAP 184* 182* 209* 192* 231* 189*   CXR: 1/21 None today  ASSESSMENT / PLAN:  PULMONARY A: CAP LUL pulmonary nodule w/ mediastinal lymphadenopathy P:   -Continue full vent support. Decrease FiO2 as tolerated. -Goal pH >7.30, SpO2 >92  -VAP bundle -Daily SBT's -Continue Abx (as below)  -Continue Solu-Cortef 50 mg q12h - Bronchoscopy today  CARDIOVASCULAR A:  H/o CAD, Takotsubo CMP. EF 50-55% on ECHO H/o AV block, s/p PPM placement Hypotension likely 2/2 septic shock d/t CAP. Improved. P:   -Solu-Cortef 50 mg q12 -Hold Coreg for now   -Continue ASA, Lipitor  RENAL A:   AKI, likely pre-renal. Resolved.  Hyponatremia. Improving. P:   -Trend BMP  GASTROINTESTINAL A:   GI PPx. Nutrition.  P:   -Continue tube feeds -Protonix 40 qd  HEMATOLOGIC A:   DVT PPx. Leukocytosis. (on steroids) P:  -Lovenox -Trend CBC -ABx as below  INFECTIOUS A:   CAP vs viral pneumonitis. Procalcitonin 1.7 Septic shock; resolving P:   -Continue Vancomycin/Zosyn -Follow Procalcitonin -Respiratory culture pending  ENDOCRINE A:   No h/o DM Normoglycemic P:   -ISS -CBG's q4h  NEUROLOGIC A:   Sedated H/o depression/anxiety P:   -Continue Fentanyl gtt - Hold Effexor -Neurontin  Joni Reining, DO PGY-1 09/28/2013 8:10 AM   PCCM ATTENDING: I have interviewed and examined the patient and reviewed the database. I have formulated the assessment and plan as reflected in the note above with amendments made by me. 40 mins of direct critical care time provided. Family updated in detail. I expressed concern but uncertainty about prognosis. They wish for her to be DNR in event of cardiac arrest. We will continue our full efforts short of ACLS through [place] and reassess @ that time. If not clearly improving by that time, will discuss alternatives. They expressed in today's conversation that she would not wish to proceed with trach tube  and long term vent support  Merton Border, MD;  PCCM service; Mobile [phone]

## 2013-09-28 NOTE — Progress Notes (Addendum)
Broch preformed on patient today, since procedure pt has required 100%/8 Peep to maintain Sats 92%. Prior to procedure pt was on 50%/8 Peep. RT consulted with Dr. Nelda Marseille to question if Recruitment Maneuver might benefit pt. MD agreed. Recruitment preformed: PC 35, RR 10, Peep 5, FiO2 100%, I time 3.00. Pt tolerated procedure fair. Post procedure pt's Sats increased to 97%. B/P maintained during procedure. Moderate, thick, tan secretions suctioned from ETT. RN aware. Pt is resting at this time. RT will continue to monitor.

## 2013-09-28 NOTE — Progress Notes (Addendum)
ANTIBIOTIC CONSULT NOTE - Follow-up  Pharmacy Consult for Vancomycin and Zosyn Indication: pneumonia  Allergies  Allergen Reactions  . Codeine Other (See Comments)    Unsure of reaction    Patient Measurements: Height: 5\' 4"  (162.6 cm) Weight: 160 lb 15 oz (73 kg) IBW/kg (Calculated) : 54.7  Vital Signs: Temp: 98.1 F (36.7 C) (01/21 0430) Temp src: Oral (01/21 0430) BP: 106/66 mmHg (01/21 0900) Pulse Rate: 81 (01/21 0900) Intake/Output from previous day: 01/20 0701 - 01/21 0700 In: 910 [I.V.:120; NG/GT:490; IV Piggyback:300] Out: 1535 [Urine:1535] Intake/Output from this shift: Total I/O In: 145 [I.V.:25; NG/GT:120] Out: 300 [Urine:300]  Labs:  Recent Labs  09/26/13 0400 09/27/13 0500 09/28/13 0500  WBC 23.2* 19.3* 20.3*  HGB 13.1 10.1* 9.1*  PLT 340 287 293  CREATININE 1.33* 0.80 0.66   Estimated Creatinine Clearance: 51.2 ml/min (by C-G formula based on Cr of 0.66). No results found for this basename: VANCOTROUGH, Corlis Leak, VANCORANDOM, [place], [place], [place], [place], [place], TOBRARND, AMIKACINPEAK, AMIKACINTROU, AMIKACIN,  in the last 72 hours   Microbiology: Recent Results (from the past 720 hour(s))  URINE CULTURE     Status: None   Collection Time    09/16/2013 11:05 PM      Result Value Range Status   Specimen Description URINE, CLEAN CATCH   Final   Special Requests NONE   Final   Culture  Setup Time     Final   Value: 09/21/2013 06:13     Performed at [facility]     Final   Value: 10,000 COLONIES/ML     Performed at [facility]   Culture     Final   Value: PSEUDOMONAS AERUGINOSA     Performed at [facility]   Report Status 09/24/2013 FINAL   Final   Organism ID, Bacteria PSEUDOMONAS AERUGINOSA   Final  CULTURE, BLOOD (ROUTINE X 2)     Status: None   Collection Time    09/21/13 12:15 AM      Result Value Range Status   Specimen Description BLOOD LEFT ANTECUBITAL   Final   Special  Requests     Final   Value: BOTTLES DRAWN AEROBIC AND ANAEROBIC AER 5cc ANA 5cc   Culture  Setup Time     Final   Value: 09/21/2013 02:20     Performed at [facility]   Culture     Final   Value: NO GROWTH 5 DAYS     Performed at [facility]   Report Status 09/27/2013 FINAL   Final  CULTURE, BLOOD (ROUTINE X 2)     Status: None   Collection Time    09/21/13 12:15 AM      Result Value Range Status   Specimen Description BLOOD RIGHT ANTECUBITAL   Final   Special Requests     Final   Value: BOTTLES DRAWN AEROBIC AND ANAEROBIC AER 5cc ANA 5cc   Culture  Setup Time     Final   Value: 09/21/2013 02:20     Performed at [facility]   Culture     Final   Value: NO GROWTH 5 DAYS     Performed at [facility]   Report Status 09/27/2013 FINAL   Final  CULTURE, BLOOD (ROUTINE X 2)     Status: None   Collection Time    09/21/13  6:10 AM      Result Value Range Status   Specimen  Description BLOOD RIGHT ARM   Final   Special Requests BOTTLES DRAWN AEROBIC AND ANAEROBIC 10CC EACH   Final   Culture  Setup Time     Final   Value: 09/21/2013 12:14     Performed at [facility]   Culture     Final   Value: NO GROWTH 5 DAYS     Performed at [facility]   Report Status 09/27/2013 FINAL   Final  CULTURE, BLOOD (ROUTINE X 2)     Status: None   Collection Time    09/21/13  6:18 AM      Result Value Range Status   Specimen Description BLOOD RIGHT ARM   Final   Special Requests BOTTLES DRAWN AEROBIC AND ANAEROBIC 10CC EACH   Final   Culture  Setup Time     Final   Value: 09/21/2013 12:14     Performed at [facility]   Culture     Final   Value: NO GROWTH 5 DAYS     Performed at [facility]   Report Status 09/27/2013 FINAL   Final  MRSA PCR SCREENING     Status: None   Collection Time    09/25/13  7:29 PM      Result Value Range Status   MRSA by PCR NEGATIVE  NEGATIVE Final   Comment:            The GeneXpert MRSA Assay  (FDA     approved for NASAL specimens     only), is one component of a     comprehensive MRSA colonization     surveillance program. It is not     intended to diagnose MRSA     infection nor to guide or     monitor treatment for     MRSA infections.  CULTURE, BLOOD (ROUTINE X 2)     Status: None   Collection Time    09/26/13 12:30 AM      Result Value Range Status   Specimen Description BLOOD CENTRAL LINE   Final   Special Requests BOTTLES DRAWN AEROBIC AND ANAEROBIC 5CC EA   Final   Culture  Setup Time     Final   Value: 09/26/2013 08:51     Performed at [facility]   Culture     Final   Value:        BLOOD CULTURE RECEIVED NO GROWTH TO DATE CULTURE WILL BE HELD FOR 5 DAYS BEFORE ISSUING A FINAL NEGATIVE REPORT     Performed at [facility]   Report Status PENDING   Incomplete  CULTURE, BLOOD (ROUTINE X 2)     Status: None   Collection Time    09/26/13  7:15 AM      Result Value Range Status   Specimen Description BLOOD RIGHT ARM   Final   Special Requests BOTTLES DRAWN AEROBIC ONLY 5CC   Final   Culture  Setup Time     Final   Value: 09/26/2013 12:49     Performed at [facility]   Culture     Final   Value:        BLOOD CULTURE RECEIVED NO GROWTH TO DATE CULTURE WILL BE HELD FOR 5 DAYS BEFORE ISSUING A FINAL NEGATIVE REPORT     Performed at [facility]   Report Status PENDING   Incomplete    Assessment  80 yr female admitted 1/14 with cough.  Required transfer  to ICU 1/18 with worsening hypoxemia. Pt now on Vancomycin and Zosyn Day#3 for PNA. Afeb. Wbc up to 20.3<19.3<23.2. PCT trending down 1.7<3.42. Patients renal function has improved in last 48hr.  1/18 Vanc>> 1/18 Zosyn>> 1/14 Tamiflu>>1/19 1/16 levaquin>>1/18 1/14 Roceph>>1/15 1/14 Azith>>1/15  1/19 Bld x2>> ngtd 1/13 Urine>> pseudomonas (pan S) 1/14 Bld x 4>>ngtd 1/14 flu neg  Goal of Therapy:  Vancomycin trough level 15-20 mcg/ml  Plan:  1. Change Vancomycin to 750  mg IV q 12 hrs. 2. Continue Zosyn 3.375g IV q 8 hrs (extended-interval 4 hr infusion). 3. Will monitor clinical course, cultures, and renal function. 4. Plan for trough at steady state.   Milli Woolridge M. Posey Pronto, PharmD Clinical Pharmacist- Resident Pager: [phone] Pharmacy: [phone] 09/28/2013 10:03 AM

## 2013-09-28 NOTE — Progress Notes (Signed)
ETT  secure no skin breakdown noted.

## 2013-09-28 NOTE — Progress Notes (Signed)
ETT tube secure no break down noted deep oral suction done.

## 2013-09-29 ENCOUNTER — Other Ambulatory Visit: Payer: Self-pay

## 2013-09-29 LAB — GLUCOSE, CAPILLARY
GLUCOSE-CAPILLARY: 200 mg/dL — AB (ref 70–99)
Glucose-Capillary: 143 mg/dL — ABNORMAL HIGH (ref 70–99)
Glucose-Capillary: 168 mg/dL — ABNORMAL HIGH (ref 70–99)
Glucose-Capillary: 201 mg/dL — ABNORMAL HIGH (ref 70–99)
Glucose-Capillary: 211 mg/dL — ABNORMAL HIGH (ref 70–99)
Glucose-Capillary: 289 mg/dL — ABNORMAL HIGH (ref 70–99)

## 2013-09-29 LAB — CBC
HEMATOCRIT: 29.7 % — AB (ref 36.0–46.0)
Hemoglobin: 9.7 g/dL — ABNORMAL LOW (ref 12.0–15.0)
MCH: 32.4 pg (ref 26.0–34.0)
MCHC: 32.7 g/dL (ref 30.0–36.0)
MCV: 99.3 fL (ref 78.0–100.0)
PLATELETS: 285 10*3/uL (ref 150–400)
RBC: 2.99 MIL/uL — ABNORMAL LOW (ref 3.87–5.11)
RDW: 14.5 % (ref 11.5–15.5)
WBC: 18.9 10*3/uL — AB (ref 4.0–10.5)

## 2013-09-29 LAB — BASIC METABOLIC PANEL
BUN: 27 mg/dL — ABNORMAL HIGH (ref 6–23)
CALCIUM: 8.5 mg/dL (ref 8.4–10.5)
CHLORIDE: 104 meq/L (ref 96–112)
CO2: 25 meq/L (ref 19–32)
Creatinine, Ser: 0.69 mg/dL (ref 0.50–1.10)
GFR calc Af Amer: >90 mL/min (ref >90)
GFR calc non Af Amer: 78 mL/min — ABNORMAL LOW (ref >90)
Glucose, Bld: 243 mg/dL — ABNORMAL HIGH (ref 70–99)
POTASSIUM: 4.4 meq/L (ref 3.7–5.3)
SODIUM: 140 meq/L (ref 137–147)

## 2013-09-29 LAB — TROPONIN I: Troponin I: <0.3 ng/mL (ref <0.30)

## 2013-09-29 LAB — MAGNESIUM: Magnesium: 2.2 mg/dL (ref 1.5–2.5)

## 2013-09-29 MED ORDER — METOPROLOL TARTRATE 1 MG/ML IV SOLN
2.5000 mg | INTRAVENOUS | Status: DC | PRN
  Administered 2013-09-29: 2.5 mg via INTRAVENOUS
  Filled 2013-09-29: qty 5

## 2013-09-29 MED ORDER — METOPROLOL TARTRATE 25 MG PO TABS
25.0000 mg | ORAL_TABLET | Freq: Two times a day (BID) | ORAL | Status: DC
  Administered 2013-09-29 – 2013-10-04 (×8): 25 mg
  Filled 2013-09-29 (×13): qty 1

## 2013-09-29 MED ORDER — INSULIN GLARGINE 100 UNIT/ML ~~LOC~~ SOLN
10.0000 [IU] | Freq: Every day | SUBCUTANEOUS | Status: DC
  Administered 2013-09-29 – 2013-10-04 (×6): 10 [IU] via SUBCUTANEOUS
  Filled 2013-09-29 (×6): qty 0.1

## 2013-09-29 NOTE — Progress Notes (Deleted)
Please consider using the ICU hyperglycemic protocol. Thank you, Rosita Kea, RN, CNS, Diabetes Coordinator [phone])

## 2013-09-29 NOTE — Progress Notes (Signed)
PULMONARY/CRITICAL CARE MEDICINE   Name: [name] MRN: [id number] DOB: Mar 07, 1927    ADMISSION DATE:  09/19/2013 CONSULTATION DATE:  09/25/13  REFERRING MD :  Grandville Silos PRIMARY SERVICE: [facility]  CHIEF COMPLAINT: Abnormal CT, pneumonia, hypoxia  BRIEF PATIENT DESCRIPTION: 80 y/o female never smoker with PMHx of CAD, takotsubo cardiomyopathy, HTN, AV block s/p PPM placement, presented 1/14 with 4 day history of cough, congestion, fever, malaise. She was admitted and also found to have UTI. Treated initially with rocephin, azithro and empiric tamiflu (although flu neg), switched to Levaquin. Patient continued to have hypoxia, CT chest showed LUL pulm nodule and PCCM consulted.   SIGNIFICANT EVENTS / STUDIES:  1/17 CT chest:  No PE, peribronchovascular soft tissue thickening extending from the left and right hilum into the lower lobes, Left upper lobe pulmonary nodule and mediastinal lymphadenopathy, BLL consolidation.  1/21 FOB: No masses noted. BI, RLL, RML airways markedly edematous and erythematous. Resp cx, AFB, cytology on washings   LINES / TUBES: ETT 1/19 >>> RIJ CVL 1/19 >>>   CULTURES: Urine 1/13>>> psuedomonas (pan sens)  BCx2 1/14>>> Neg Urine legionella 1/14>>> NEG  Urine strep 1/14>>> NEG  Flu PCR 1/14>> NEG  MRSA 1/18 >>> NEG Blood 1/19 >>  Resp 1/19 >>  Resp (bronch) 1/21 >>  AFB (bronch) 1/21 >>   ANTIBIOTICS: Azithro 1/14 >>> 1/14 Rocephin 1/14 >>> 1/14 Levaquin 1/16 >>>1/18 Zosyn 1/18 >>> Vancomycin 1/18 >>> Tamiflu 1/15 >>> 1/19   SUBJECTIVE: Sedated, intermittent episodes of tachycardia early this AM. HR reportedly up to 160s, follow by periods as low as 70s that are paced.   VITAL SIGNS: Temp:  [97.5 F (36.4 C)-98.6 F (37 C)] 97.7 F (36.5 C) (01/22 0400) Pulse Rate:  [78-139] 139 (01/22 0700) Resp:  [18-24] 20 (01/22 0700) BP: (94-119)/(46-76) 105/72 mmHg (01/22 0700) SpO2:  [91 %-98 %] 96 % (01/22 0700) FiO2 (%):  [50 %-100 %] 80 %  (01/22 0700) Weight:  [153 lb 10.6 oz (69.7 kg)-160 lb 15 oz (73 kg)] 153 lb 10.6 oz (69.7 kg) (01/22 0500)  HEMODYNAMICS: CVP:  [10 mmHg] 10 mmHg  VENTILATOR SETTINGS: Vent Mode:  [-] PRVC FiO2 (%):  [50 %-100 %] 80 % Set Rate:  [20 bmp] 20 bmp Vt Set:  [430 mL] 430 mL PEEP:  [8 cmH20] 8 cmH20 Plateau Pressure:  [16 cmH20-23 cmH20] 23 cmH20  INTAKE / OUTPUT: Intake/Output     01/21 0701 - 01/22 0700 01/22 0701 - 01/23 0700   I.V. (mL/kg) 231.5 (3.3)    NG/GT 940    IV Piggyback 450    Total Intake(mL/kg) 1621.5 (23.3)    Urine (mL/kg/hr) 3085 (1.8)    Total Output 3085     Net -1463.5            PHYSICAL EXAMINATION:  General: Sedated on mechanical ventalation, NAD.  Neuro: Sedated. RASS -2. HEENT: ETT/OGT in place.  Cardiovascular: Tachycardic, no appreciated murmur Lungs: Coarse breath sounds B/L, no wheezing Abdomen: Soft, +bs  Musculoskeletal: Warm and dry, no edema  LABS:  CBC  Recent Labs Lab 09/27/13 0500 09/28/13 0500 09/29/13 0243  WBC 19.3* 20.3* 18.9*  HGB 10.1* 9.1* 9.7*  HCT 30.2* 27.7* 29.7*  PLT 287 293 285   Coag's No results found for this basename: APTT, INR,  in the last 168 hours BMET  Recent Labs Lab 09/27/13 0500 09/28/13 0500 09/29/13 0243  NA 133* 135* 140  K 3.8 4.2 4.4  CL 103 104 104  CO2 18* 21 25  BUN 19 24* 27*  CREATININE 0.80 0.66 0.69  GLUCOSE 217* 196* 243*   Electrolytes  Recent Labs Lab 09/27/13 0500 09/28/13 0500 09/29/13 0243 09/29/13 0500  CALCIUM 7.5* 8.3* 8.5  --   MG  --   --   --  2.2   Sepsis Markers  Recent Labs Lab 09/26/13 1105 09/27/13 0500 09/28/13 0500  PROCALCITON 3.42 2.58 1.70   ABG  Recent Labs Lab 09/25/13 2130 09/26/13 0025 09/26/13 1809  PHART 7.448 7.317* 7.327*  PCO2ART 35.8 38.8 31.7*  PO2ART 71.4* 104.0* 73.0*   Liver Enzymes  Recent Labs Lab 09/25/13 1730 09/28/13 0500  AST 56* 46*  ALT 37* 42*  ALKPHOS 78 68  BILITOT 0.4 0.2*  ALBUMIN 2.3* 1.5*    Cardiac Enzymes  Recent Labs Lab 09/25/13 1719 09/29/13 0535  TROPONINI  --  <0.30  PROBNP 1223.0*  --    Glucose  Recent Labs Lab 09/28/13 0800 09/28/13 1158 09/28/13 1515 09/28/13 1920 09/28/13 2342 09/29/13 0422  GLUCAP 165* 160* 156* 164* 289* 200*   CXR: 1/22 None today  ASSESSMENT / PLAN:  PULMONARY A: Acute Respiratory failure CAP LUL pulmonary nodule w/ mediastinal lymphadenopathy P:   -Continue full vent support. Decrease FiO2 as tolerated. -Goal pH >7.30, SpO2 >92  -VAP bundle -Daily SBT's -Continue Abx (as below)   CARDIOVASCULAR A:  H/o CAD, Takotsubo CMP. EF 50-55% on ECHO H/o AV block, s/p PPM placement Hypotension likely 2/2 septic shock d/t CAP. Improved. Intermittent tachycardia P:   -D/C Solu-Cortef 50 mg q12 -Start Metoprolol 25 BID   -Continue ASA, Lipitor   RENAL A:   AKI. Resolved.  Hyponatremia. Resolved. P:   -Trend BMP  GASTROINTESTINAL A:   GI PPx. Nutrition.  P:   -Continue tube feeds -Protonix 40 qd  HEMATOLOGIC A:   DVT PPx. Leukocytosis. (on steroids) Anemia- stable P:  -Lovenox -Trend CBC -ABx as above  INFECTIOUS A:   CAP. Septic shock; resolved P:   -Abx and cultures as above -Follow Procalcitonin  ENDOCRINE A:   No h/o DM Hyperglycemic P:   -ISS -CBG's q4h - Lantus 10 units -D/C steroids  NEUROLOGIC A:   Sedated H/o depression/anxiety P:   -Continue Fentanyl gtt -Versed PRN - Hold Effexor -Neurontin  Joni Reining, DO PGY-1 09/29/2013 7:28 AM  PCCM ATTENDING: I have interviewed and examined the patient and reviewed the database. I have formulated the assessment and plan as reflected in the note above with amendments made by me. 35 mins of direct critical care time provided  Merton Border, MD;  PCCM service; Mobile [phone]

## 2013-09-29 NOTE — Progress Notes (Signed)
CRITICAL CARE RESIDENT NOTE Interim Progress Note   Subjective:    I was called overnight by the RN for evaluation of intermittent tachycardia into the 130-140's. At time of evaluation, pt w/ HR into 130's. Patient resting comfortably, on ventilator, sedated, but responsive, RASS -1.   Objective:    BP 107/59  Pulse 107  Temp(Src) 97.7 F (36.5 C) (Oral)  Resp 20  Ht 5\' 4"  (1.626 m)  Wt 160 lb 15 oz (73 kg)  BMI 27.61 kg/m2  SpO2 95%   Labs: Basic Metabolic Panel:    Component Value Date/Time   NA 140 09/29/2013 0243   K 4.4 09/29/2013 0243   CL 104 09/29/2013 0243   CO2 25 09/29/2013 0243   BUN 27* 09/29/2013 0243   CREATININE 0.69 09/29/2013 0243   GLUCOSE 243* 09/29/2013 0243   CALCIUM 8.5 09/29/2013 0243    CBC:    Component Value Date/Time   WBC 18.9* 09/29/2013 0243   HGB 9.7* 09/29/2013 0243   HCT 29.7* 09/29/2013 0243   PLT 285 09/29/2013 0243   MCV 99.3 09/29/2013 0243   NEUTROABS 10.3* 09/22/2013 0313   LYMPHSABS 1.6 09/22/2013 0313   MONOABS 2.9* 09/22/2013 0313   EOSABS 0.5 09/22/2013 0313   BASOSABS 0.1 09/22/2013 0313    Cardiac Enzymes: Lab Results  Component Value Date   CKTOTAL 187* 03/15/2011   CKMB 6.2* 03/15/2011   TROPONINI <0.30 09/21/2013    Physical Exam: General: Vital signs reviewed and noted. Well-developed, well-nourished, in no acute distress; RASS -1  Lungs:  Clear to auscultation BL without crackles or wheezes. On vent.  Heart: Tachycardic. S1 and S2 normal without gallop, murmur, or rubs.  Abdomen:  BS normoactive. Soft, Nondistended, non-tender.  No masses or organomegaly.  Extremities: No pretibial edema.    Assessment/ Plan:    80 y/o female never smoker with PMHx of CAD, takotsubo cardiomyopathy, HTN, AV block s/p PPM placement, admitted for pneumonia. Patient w/ intermittent tachy/brady syndrome. Patient w/ FOB today, no masses noted. BI, RLL, RML airways markedly edematous and erythematous.  -Metoprolol 2.5 mg q3h prn for HR >115;  HOLD if SBP <100 or MAP <60 -Electrolytes wnl.  -EKG -Troponin x 1 -Continue to monitor. Consider cardiology consult today given extensive cardiac history.   Signed: Corky Sox, MD  PGY-1, Internal Medicine Resident Pager: [phone] (7PM-7AM) 09/29/2013, 4:45 AM

## 2013-09-30 ENCOUNTER — Inpatient Hospital Stay (HOSPITAL_COMMUNITY): Payer: Medicare HMO

## 2013-09-30 DIAGNOSIS — J9600 Acute respiratory failure, unspecified whether with hypoxia or hypercapnia: Secondary | ICD-10-CM

## 2013-09-30 LAB — GLUCOSE, CAPILLARY
GLUCOSE-CAPILLARY: 102 mg/dL — AB (ref 70–99)
GLUCOSE-CAPILLARY: 81 mg/dL (ref 70–99)
GLUCOSE-CAPILLARY: 157 mg/dL — AB (ref 70–99)
Glucose-Capillary: 137 mg/dL — ABNORMAL HIGH (ref 70–99)
Glucose-Capillary: 147 mg/dL — ABNORMAL HIGH (ref 70–99)
Glucose-Capillary: 149 mg/dL — ABNORMAL HIGH (ref 70–99)
Glucose-Capillary: 185 mg/dL — ABNORMAL HIGH (ref 70–99)

## 2013-09-30 LAB — CULTURE, RESPIRATORY

## 2013-09-30 LAB — CULTURE, RESPIRATORY W GRAM STAIN

## 2013-09-30 LAB — CULTURE, BAL-QUANTITATIVE W GRAM STAIN: Colony Count: 50000

## 2013-09-30 LAB — BASIC METABOLIC PANEL
BUN: 27 mg/dL — ABNORMAL HIGH (ref 6–23)
CALCIUM: 8.7 mg/dL (ref 8.4–10.5)
CHLORIDE: 107 meq/L (ref 96–112)
CO2: 28 meq/L (ref 19–32)
Creatinine, Ser: 0.66 mg/dL (ref 0.50–1.10)
GFR calc Af Amer: >90 mL/min (ref >90)
GFR calc non Af Amer: 79 mL/min — ABNORMAL LOW (ref >90)
GLUCOSE: 103 mg/dL — AB (ref 70–99)
POTASSIUM: 4.2 meq/L (ref 3.7–5.3)
SODIUM: 144 meq/L (ref 137–147)

## 2013-09-30 LAB — POCT I-STAT 3, ART BLOOD GAS (G3+)
Acid-Base Excess: 4 mmol/L — ABNORMAL HIGH (ref 0.0–2.0)
Bicarbonate: 28.7 mEq/L — ABNORMAL HIGH (ref 20.0–24.0)
O2 Saturation: 92 %
PH ART: 7.457 — AB (ref 7.350–7.450)
TCO2: 30 mmol/L (ref 0–100)
pCO2 arterial: 40.6 mmHg (ref 35.0–45.0)
pO2, Arterial: 61 mmHg — ABNORMAL LOW (ref 80.0–100.0)

## 2013-09-30 LAB — CBC
HEMATOCRIT: 32.8 % — AB (ref 36.0–46.0)
HEMOGLOBIN: 10.4 g/dL — AB (ref 12.0–15.0)
MCH: 32.1 pg (ref 26.0–34.0)
MCHC: 31.7 g/dL (ref 30.0–36.0)
MCV: 101.2 fL — AB (ref 78.0–100.0)
Platelets: 338 10*3/uL (ref 150–400)
RBC: 3.24 MIL/uL — AB (ref 3.87–5.11)
RDW: 15.6 % — ABNORMAL HIGH (ref 11.5–15.5)
WBC: 16.7 10*3/uL — ABNORMAL HIGH (ref 4.0–10.5)

## 2013-09-30 LAB — CULTURE, BAL-QUANTITATIVE

## 2013-09-30 MED ORDER — ACETYLCYSTEINE 20 % IN SOLN
3.0000 mL | Freq: Two times a day (BID) | RESPIRATORY_TRACT | Status: DC
  Administered 2013-09-30: 21:00:00 via RESPIRATORY_TRACT
  Administered 2013-09-30: 3 mL via RESPIRATORY_TRACT
  Administered 2013-10-01: 20:00:00 via RESPIRATORY_TRACT
  Administered 2013-10-01: 3 mL via RESPIRATORY_TRACT
  Administered 2013-10-02: 20:00:00 via RESPIRATORY_TRACT
  Administered 2013-10-02: 3 mL via RESPIRATORY_TRACT
  Administered 2013-10-03: 08:00:00 via RESPIRATORY_TRACT
  Filled 2013-09-30 (×9): qty 4

## 2013-09-30 MED ORDER — ALBUTEROL SULFATE (2.5 MG/3ML) 0.083% IN NEBU
INHALATION_SOLUTION | RESPIRATORY_TRACT | Status: AC
  Filled 2013-09-30: qty 3

## 2013-09-30 MED ORDER — ACETYLCYSTEINE 20 % IN SOLN
3.0000 mL | Freq: Two times a day (BID) | RESPIRATORY_TRACT | Status: DC
  Filled 2013-09-30 (×2): qty 4

## 2013-09-30 MED ORDER — FUROSEMIDE 8 MG/ML PO SOLN
40.0000 mg | Freq: Once | ORAL | Status: DC
  Filled 2013-09-30: qty 5

## 2013-09-30 MED ORDER — ALBUTEROL SULFATE (2.5 MG/3ML) 0.083% IN NEBU
2.5000 mg | INHALATION_SOLUTION | Freq: Four times a day (QID) | RESPIRATORY_TRACT | Status: DC
  Administered 2013-09-30 – 2013-10-04 (×18): 2.5 mg via RESPIRATORY_TRACT
  Filled 2013-09-30 (×17): qty 3

## 2013-09-30 MED ORDER — FUROSEMIDE 10 MG/ML IJ SOLN
40.0000 mg | Freq: Once | INTRAMUSCULAR | Status: AC
  Administered 2013-09-30: 40 mg via INTRAVENOUS
  Filled 2013-09-30: qty 4

## 2013-09-30 MED ORDER — SODIUM CHLORIDE 0.9 % IV SOLN: INTRAVENOUS | Status: DC

## 2013-09-30 NOTE — Progress Notes (Signed)
PULMONARY/CRITICAL CARE MEDICINE   Name: [name] MRN: [id number] DOB: December 08, 1926    ADMISSION DATE:  09/26/2013 CONSULTATION DATE:  09/25/13  REFERRING MD :  Grandville Silos PRIMARY SERVICE: [facility]  CHIEF COMPLAINT: Abnormal CT, pneumonia, hypoxia  BRIEF PATIENT DESCRIPTION: 80 y/o female never smoker with PMHx of CAD, takotsubo cardiomyopathy, HTN, AV block s/p PPM placement, presented 1/14 with 4 day history of cough, congestion, fever, malaise. She was admitted and also found to have UTI. Treated initially with rocephin, azithro and empiric tamiflu (although flu neg), switched to Levaquin. Patient continued to have hypoxia, CT chest showed LUL pulm nodule and PCCM consulted.   SIGNIFICANT EVENTS / STUDIES:  1/17 CT chest:  No PE, peribronchovascular soft tissue thickening extending from the left and right hilum into the lower lobes, Left upper lobe pulmonary nodule and mediastinal lymphadenopathy, BLL consolidation.  1/21 FOB: No masses noted. BI, RLL, RML airways markedly edematous and erythematous. Resp cx, AFB, cytology on washings   LINES / TUBES: ETT 1/19 >>> OGT 1/19 >>> RIJ CVL 1/19 >>>   CULTURES: Urine 1/13>>> psuedomonas (pan sens)  BCx2 1/14>>> Neg Urine legionella 1/14>>> NEG  Urine strep 1/14>>> NEG  Flu PCR 1/14>> NEG  MRSA 1/18 >>> NEG Blood 1/19 >>  Resp 1/19 >>  Resp (bronch) 1/21 >>  AFB (bronch) 1/21 >>   ANTIBIOTICS: Azithro 1/14 >>> 1/14 Rocephin 1/14 >>> 1/14 Levaquin 1/16 >>>1/18 Zosyn 1/18 >>> Vancomycin 1/18 >>> Tamiflu 1/15 >>> 1/19   SUBJECTIVE: No events overnight.     VITAL SIGNS: Temp:  [97.2 F (36.2 C)-98.9 F (37.2 C)] 98.8 F (37.1 C) (01/23 0425) Pulse Rate:  [68-111] 80 (01/23 0700) Resp:  [14-23] 16 (01/23 0700) BP: (96-129)/(48-76) 110/55 mmHg (01/23 0700) SpO2:  [87 %-97 %] 87 % (01/23 0700) FiO2 (%):  [50 %-80 %] 50 % (01/23 0302) Weight:  [160 lb 0.9 oz (72.6 kg)] 160 lb 0.9 oz (72.6 kg) (01/23  0500)  HEMODYNAMICS:    VENTILATOR SETTINGS: Vent Mode:  [-] PRVC FiO2 (%):  [50 %-80 %] 50 % Set Rate:  [16 bmp-20 bmp] 16 bmp Vt Set:  [430 mL] 430 mL PEEP:  [8 cmH20] 8 cmH20 Plateau Pressure:  [12 cmH20-20 cmH20] 16 cmH20  INTAKE / OUTPUT: Intake/Output     01/22 0701 - 01/23 0700 01/23 0701 - 01/24 0700   I.V. (mL/kg) 337.4 (4.6)    NG/GT 1210    IV Piggyback 462.5    Total Intake(mL/kg) 2009.9 (27.7)    Urine (mL/kg/hr) 1040 (0.6)    Total Output 1040     Net +969.9            PHYSICAL EXAMINATION:  General: Sedated on mechanical ventalation, NAD.  Neuro: Sedated. RASS -2. HEENT: ETT/OGT in place.  Cardiovascular: RRR, no appreciated murmur Lungs: Coarse breath sounds B/L, no wheezing Abdomen: Soft, +bs  Musculoskeletal: Warm and dry, no edema  LABS:  CBC  Recent Labs Lab 09/28/13 0500 09/29/13 0243 09/30/13 0500  WBC 20.3* 18.9* 16.7*  HGB 9.1* 9.7* 10.4*  HCT 27.7* 29.7* 32.8*  PLT 293 285 338   Coag's No results found for this basename: APTT, INR,  in the last 168 hours BMET  Recent Labs Lab 09/28/13 0500 09/29/13 0243 09/30/13 0500  NA 135* 140 144  K 4.2 4.4 4.2  CL 104 104 107  CO2 21 25 28   BUN 24* 27* 27*  CREATININE 0.66 0.69 0.66  GLUCOSE 196* 243* 103*   Electrolytes  Recent Labs Lab 09/28/13 0500 09/29/13 0243 09/29/13 0500 09/30/13 0500  CALCIUM 8.3* 8.5  --  8.7  MG  --   --  2.2  --    Sepsis Markers  Recent Labs Lab 09/26/13 1105 09/27/13 0500 09/28/13 0500  PROCALCITON 3.42 2.58 1.70   ABG  Recent Labs Lab 09/25/13 2130 09/26/13 0025 09/26/13 1809  PHART 7.448 7.317* 7.327*  PCO2ART 35.8 38.8 31.7*  PO2ART 71.4* 104.0* 73.0*   Liver Enzymes  Recent Labs Lab 09/25/13 1730 09/28/13 0500  AST 56* 46*  ALT 37* 42*  ALKPHOS 78 68  BILITOT 0.4 0.2*  ALBUMIN 2.3* 1.5*   Cardiac Enzymes  Recent Labs Lab 09/25/13 1719 09/29/13 0535  TROPONINI  --  <0.30  PROBNP 1223.0*  --     Glucose  Recent Labs Lab 09/29/13 0741 09/29/13 1146 09/29/13 1532 09/29/13 1924 09/29/13 2350 09/30/13 0424  GLUCAP 201* 211* 168* 143* 185* 102*   CXR: 1/23 Hardware in good position, B/L pleural effusions  ASSESSMENT / PLAN:  PULMONARY A: Acute Respiratory failure CAP, NOS LUL pulmonary nodule w/ mediastinal lymphadenopathy B/L Pleural Effusions P:   -Continue full vent support. Decrease FiO2 as tolerated. -Cont VAP bundle -Daily SBT's as indicated -Cont BDs -Add Mucomyst nebs 1/23 - Diuresis as below, if no improvement with diuresis may need theraputic thoracentesis. - CXR tomorrow am - upright 90 degrees to eval pleural effusions  CARDIOVASCULAR A:  H/o CAD, Takotsubo CMP. EF 50-55% on ECHO H/o AV block, s/p PPM placement Hypotension likely 2/2 septic shock d/t CAP. Improved. P:   -Metoprolol 25 BID   -Continue ASA, Lipitor  RENAL A:   AKI. Resolved.  Hyponatremia. Resolved. Stay positive 2 L, Weight 20 lbs over measurement on admission P:   -Trend BMP - Lasix 40mg  IV goal neg 1-2 L today. - If not at goal will repeat with potassium supplementation.  GASTROINTESTINAL A:   GI PPx. Nutrition.  P:   -Continue tube feeds -Protonix 40 qd  HEMATOLOGIC A:   DVT PPx. Leukocytosis. Improving after steroids D/Ced Anemia- stable P:  -Lovenox -Trend CBC  INFECTIOUS A:   CAP. Septic shock; resolved P:   -Abx and cultures as above  ENDOCRINE A:   No h/o DM Glucose control improved P:   -ISS -CBG's q4h - Lantus 10 units  NEUROLOGIC A:   Sedated H/o depression/anxiety P:   -Continue Fentanyl gtt -Versed PRN - Hold Effexor -Neurontin  Joni Reining, DO PGY-1 09/30/2013 7:20 AM   PCCM ATTENDING: I have interviewed and examined the patient and reviewed the database. I have formulated the assessment and plan as reflected in the note above with amendments made by me. 40 mins of direct critical care time provided. Granddaughter  updated @ bedside    Merton Border, MD;  PCCM service; Mobile [phone]

## 2013-09-30 NOTE — Progress Notes (Signed)
Chaplain offered emotional and spiritual support to pt's granddaughter at pt's bedside. She described the pt as "the rock" of the family, which is her mother, her, and her brother, and grandchildren. She expressed concern for her grandmother, saying "She doesn't seem to be getting any better." She expressed desire for her children to visit their grandmother, but understand the hospital's current restrictions for minors. Chaplain listened empathically to her concerns, offered emotional support, and provided a caring presence. She was grateful for chaplain support.   [facility]: [phone]

## 2013-10-01 ENCOUNTER — Inpatient Hospital Stay (HOSPITAL_COMMUNITY): Payer: Medicare HMO

## 2013-10-01 LAB — CBC
HCT: 33.4 % — ABNORMAL LOW (ref 36.0–46.0)
Hemoglobin: 11 g/dL — ABNORMAL LOW (ref 12.0–15.0)
MCH: 32.9 pg (ref 26.0–34.0)
MCHC: 32.9 g/dL (ref 30.0–36.0)
MCV: 100 fL (ref 78.0–100.0)
PLATELETS: 369 10*3/uL (ref 150–400)
RBC: 3.34 MIL/uL — ABNORMAL LOW (ref 3.87–5.11)
RDW: 15.8 % — AB (ref 11.5–15.5)
WBC: 18.9 10*3/uL — ABNORMAL HIGH (ref 4.0–10.5)

## 2013-10-01 LAB — GLUCOSE, CAPILLARY
GLUCOSE-CAPILLARY: 142 mg/dL — AB (ref 70–99)
GLUCOSE-CAPILLARY: 127 mg/dL — AB (ref 70–99)
GLUCOSE-CAPILLARY: 105 mg/dL — AB (ref 70–99)
Glucose-Capillary: 110 mg/dL — ABNORMAL HIGH (ref 70–99)
Glucose-Capillary: 152 mg/dL — ABNORMAL HIGH (ref 70–99)

## 2013-10-01 LAB — BASIC METABOLIC PANEL
BUN: 31 mg/dL — ABNORMAL HIGH (ref 6–23)
CALCIUM: 9.1 mg/dL (ref 8.4–10.5)
CO2: 30 meq/L (ref 19–32)
CREATININE: 0.77 mg/dL (ref 0.50–1.10)
Chloride: 105 mEq/L (ref 96–112)
GFR calc Af Amer: 87 mL/min — ABNORMAL LOW (ref >90)
GFR, EST NON AFRICAN AMERICAN: 75 mL/min — AB (ref >90)
Glucose, Bld: 105 mg/dL — ABNORMAL HIGH (ref 70–99)
Potassium: 4.5 mEq/L (ref 3.7–5.3)
SODIUM: 144 meq/L (ref 137–147)

## 2013-10-01 LAB — PRO B NATRIURETIC PEPTIDE: Pro B Natriuretic peptide (BNP): 1790 pg/mL — ABNORMAL HIGH (ref 0–450)

## 2013-10-01 MED ORDER — FUROSEMIDE 10 MG/ML IJ SOLN
40.0000 mg | Freq: Once | INTRAMUSCULAR | Status: AC
  Administered 2013-10-01: 40 mg via INTRAVENOUS
  Filled 2013-10-01: qty 4

## 2013-10-01 NOTE — Progress Notes (Signed)
ANTIBIOTIC CONSULT NOTE - Follow-up  Pharmacy Consult for Zosyn Indication: pneumonia  Allergies  Allergen Reactions  . Codeine Other (See Comments)    Unsure of reaction    Patient Measurements: Height: 5\' 4"  (162.6 cm) Weight: 158 lb 1.1 oz (71.7 kg) IBW/kg (Calculated) : 54.7  Vital Signs: Temp: 98.7 F (37.1 C) (01/24 0819) Temp src: Oral (01/24 0819) BP: 122/66 mmHg (01/24 1015) Pulse Rate: 106 (01/24 1000) Intake/Output from previous day: 01/23 0701 - 01/24 0700 In: 1819.9 [I.V.:589.9; NG/GT:980; IV Piggyback:250] Out: 2725 [Urine:2725] Intake/Output from this shift:    Labs:  Recent Labs  09/29/13 0243 09/30/13 0500 10/01/13 0830  WBC 18.9* 16.7* 18.9*  HGB 9.7* 10.4* 11.0*  PLT 285 338 369  CREATININE 0.69 0.66 0.77   Estimated Creatinine Clearance: 50.8 ml/min (by C-G formula based on Cr of 0.77). No results found for this basename: VANCOTROUGH, Corlis Leak, VANCORANDOM, [place], [place], [place], [place], [place], TOBRARND, AMIKACINPEAK, AMIKACINTROU, AMIKACIN,  in the last 72 hours   Microbiology: Recent Results (from the past 720 hour(s))  URINE CULTURE     Status: None   Collection Time    09/12/2013 11:05 PM      Result Value Range Status   Specimen Description URINE, CLEAN CATCH   Final   Special Requests NONE   Final   Culture  Setup Time     Final   Value: 09/21/2013 06:13     Performed at [facility]     Final   Value: 10,000 COLONIES/ML     Performed at [facility]   Culture     Final   Value: PSEUDOMONAS AERUGINOSA     Performed at [facility]   Report Status 09/24/2013 FINAL   Final   Organism ID, Bacteria PSEUDOMONAS AERUGINOSA   Final  CULTURE, BLOOD (ROUTINE X 2)     Status: None   Collection Time    09/21/13 12:15 AM      Result Value Range Status   Specimen Description BLOOD LEFT ANTECUBITAL   Final   Special Requests     Final   Value: BOTTLES DRAWN AEROBIC AND ANAEROBIC  AER 5cc ANA 5cc   Culture  Setup Time     Final   Value: 09/21/2013 02:20     Performed at [facility]   Culture     Final   Value: NO GROWTH 5 DAYS     Performed at [facility]   Report Status 09/27/2013 FINAL   Final  CULTURE, BLOOD (ROUTINE X 2)     Status: None   Collection Time    09/21/13 12:15 AM      Result Value Range Status   Specimen Description BLOOD RIGHT ANTECUBITAL   Final   Special Requests     Final   Value: BOTTLES DRAWN AEROBIC AND ANAEROBIC AER 5cc ANA 5cc   Culture  Setup Time     Final   Value: 09/21/2013 02:20     Performed at [facility]   Culture     Final   Value: NO GROWTH 5 DAYS     Performed at [facility]   Report Status 09/27/2013 FINAL   Final  CULTURE, BLOOD (ROUTINE X 2)     Status: None   Collection Time    09/21/13  6:10 AM      Result Value Range Status   Specimen Description BLOOD RIGHT ARM   Final  Special Requests BOTTLES DRAWN AEROBIC AND ANAEROBIC 10CC EACH   Final   Culture  Setup Time     Final   Value: 09/21/2013 12:14     Performed at [facility]   Culture     Final   Value: NO GROWTH 5 DAYS     Performed at [facility]   Report Status 09/27/2013 FINAL   Final  CULTURE, BLOOD (ROUTINE X 2)     Status: None   Collection Time    09/21/13  6:18 AM      Result Value Range Status   Specimen Description BLOOD RIGHT ARM   Final   Special Requests BOTTLES DRAWN AEROBIC AND ANAEROBIC 10CC EACH   Final   Culture  Setup Time     Final   Value: 09/21/2013 12:14     Performed at [facility]   Culture     Final   Value: NO GROWTH 5 DAYS     Performed at [facility]   Report Status 09/27/2013 FINAL   Final  MRSA PCR SCREENING     Status: None   Collection Time    09/25/13  7:29 PM      Result Value Range Status   MRSA by PCR NEGATIVE  NEGATIVE Final   Comment:            The GeneXpert MRSA Assay (FDA     approved for NASAL specimens     only), is one  component of a     comprehensive MRSA colonization     surveillance program. It is not     intended to diagnose MRSA     infection nor to guide or     monitor treatment for     MRSA infections.  CULTURE, BLOOD (ROUTINE X 2)     Status: None   Collection Time    09/26/13 12:30 AM      Result Value Range Status   Specimen Description BLOOD CENTRAL LINE   Final   Special Requests BOTTLES DRAWN AEROBIC AND ANAEROBIC 5CC EA   Final   Culture  Setup Time     Final   Value: 09/26/2013 08:51     Performed at [facility]   Culture     Final   Value:        BLOOD CULTURE RECEIVED NO GROWTH TO DATE CULTURE WILL BE HELD FOR 5 DAYS BEFORE ISSUING A FINAL NEGATIVE REPORT     Performed at [facility]   Report Status PENDING   Incomplete  CULTURE, BLOOD (ROUTINE X 2)     Status: None   Collection Time    09/26/13  7:15 AM      Result Value Range Status   Specimen Description BLOOD RIGHT ARM   Final   Special Requests BOTTLES DRAWN AEROBIC ONLY 5CC   Final   Culture  Setup Time     Final   Value: 09/26/2013 12:49     Performed at [facility]   Culture     Final   Value:        BLOOD CULTURE RECEIVED NO GROWTH TO DATE CULTURE WILL BE HELD FOR 5 DAYS BEFORE ISSUING A FINAL NEGATIVE REPORT     Performed at [facility]   Report Status PENDING   Incomplete  AFB CULTURE WITH SMEAR     Status: None   Collection Time    09/28/13 12:28 PM  Result Value Range Status   Specimen Description BRONCHIAL ALVEOLAR LAVAGE   Final   Special Requests NONE   Final   ACID FAST SMEAR     Final   Value: NO ACID FAST BACILLI SEEN     Performed at [facility]   Culture     Final   Value: CULTURE WILL BE EXAMINED FOR 6 WEEKS BEFORE ISSUING A FINAL REPORT     Performed at [facility]   Report Status PENDING   Incomplete  CULTURE, BAL-QUANTITATIVE     Status: None   Collection Time    09/28/13 12:28 PM      Result Value Range Status   Specimen Description  BRONCHIAL ALVEOLAR LAVAGE   Final   Special Requests NONE   Final   Gram Stain     Final   Value: RARE WBC PRESENT,BOTH PMN AND MONONUCLEAR     NO SQUAMOUS EPITHELIAL CELLS SEEN     NO ORGANISMS SEEN     Performed at [facility]     Final   Value: 50,000 COLONIES/ML     Performed at [facility]   Culture     Final   Value: CANDIDA ALBICANS     Performed at [facility]   Report Status 09/30/2013 FINAL   Final  CULTURE, RESPIRATORY (NON-EXPECTORATED)     Status: None   Collection Time    09/28/13 12:29 PM      Result Value Range Status   Specimen Description BRONCHIAL ALVEOLAR LAVAGE   Final   Special Requests NONE   Final   Gram Stain     Final   Value: RARE WBC PRESENT,BOTH PMN AND MONONUCLEAR     NO SQUAMOUS EPITHELIAL CELLS SEEN     NO ORGANISMS SEEN     Performed at [facility]   Culture     Final   Value: FEW CANDIDA ALBICANS     Performed at [facility]   Report Status 09/30/2013 FINAL   Final    Assessment  80 yr female admitted 1/14 with cough.  Required transfer to ICU 1/18 with worsening hypoxemia. Pt now on Zosyn Day#6 for PNA and Pseudomonas UTI. Low grade fever of 100.3 overnight. Wbc up to 18.9. PCT trending down 1.7<3.42. Patients renal function has improved back to admission value in last 48hr. CXR remains stable.  1/18 Vanc>> 1/23 (Discontinued by Dr. Doug Sou on 1/23).  1/18 Zosyn>> 1/14 Tamiflu>>1/19 1/16 levaquin>>1/18 1/14 Roceph>>1/15 1/14 Azith>>1/15  1/19 Bld x2>> ngtd 1/13 Urine>> pseudomonas (pan S) 1/14 Bld x 4>>ngtd 1/14 flu neg  Goal of Therapy:  Clinical resolution of infection  Plan:  1. Continue Zosyn 3.375g IV q 8 hrs (extended-interval 4 hr infusion). 2. Will monitor clinical course, cultures, and renal function.  Sloan Leiter, PharmD, BCPS Clinical Pharmacist [phone] 10/01/2013 10:29 AM

## 2013-10-01 NOTE — Progress Notes (Signed)
PULMONARY/CRITICAL CARE MEDICINE   Name: [name] MRN: [id number] DOB: 1927-05-08    ADMISSION DATE:  09/22/2013 CONSULTATION DATE:  09/25/13  REFERRING MD :  Grandville Silos PRIMARY SERVICE: [facility]  CHIEF COMPLAINT: Abnormal CT, pneumonia, hypoxia  BRIEF PATIENT DESCRIPTION: 80 y/o female never smoker with PMHx of CAD, takotsubo cardiomyopathy, HTN, AV block s/p PPM placement, presented 1/14 with 4 day history of cough, congestion, fever, malaise. She was admitted and also found to have UTI. Treated initially with rocephin, azithro and empiric tamiflu (although flu neg), switched to Levaquin. Patient continued to have hypoxia, CT chest showed LUL pulm nodule and PCCM consulted.   SIGNIFICANT EVENTS / STUDIES:  1/17 CT chest:  No PE, peribronchovascular soft tissue thickening extending from the left and right hilum into the lower lobes, Left upper lobe pulmonary nodule and mediastinal lymphadenopathy, BLL consolidation.  1/21 FOB: No masses noted. BI, RLL, RML airways markedly edematous and erythematous. Resp cx, AFB, cytology on washings   LINES / TUBES: ETT 1/19 >>> OGT 1/19 >>> RIJ CVL 1/19 >>>   CULTURES: Urine 1/13>>> pseudomonas (pan sens)  BCx2 1/14>>> Neg Urine legionella 1/14>>> NEG  Urine strep 1/14>>> NEG  Flu PCR 1/14>> NEG  MRSA 1/18 >>> NEG Blood 1/19 >>  Resp 1/19 >>  Resp (bronch) 1/21 >> candida 50K AFB (bronch) 1/21 >> neg prelim  ANTIBIOTICS: Azithro 1/14 >>> 1/14 Rocephin 1/14 >>> 1/14 Levaquin 1/16 >>>1/18 Zosyn 1/18 >>> Vancomycin 1/18 >>> Tamiflu 1/15 >>> 1/19   SUBJECTIVE:  No events overnight.    Does not follow commands Intermittent agitation on fent Wt up 18 lbs since admit    VITAL SIGNS: Temp:  [98.6 F (37 C)-100.3 F (37.9 C)] 100.1 F (37.8 C) (01/24 0411) Pulse Rate:  [74-104] 91 (01/24 0500) Resp:  [16-27] 16 (01/24 0500) BP: (97-140)/(45-109) 112/49 mmHg (01/24 0500) SpO2:  [87 %-100 %] 94 % (01/24 0500) FiO2 (%):   [40 %-50 %] 40 % (01/24 0344) Weight:  [71.7 kg (158 lb 1.1 oz)] 71.7 kg (158 lb 1.1 oz) (01/24 0500)  HEMODYNAMICS:    VENTILATOR SETTINGS: Vent Mode:  [-] PRVC FiO2 (%):  [40 %-50 %] 40 % Set Rate:  [16 bmp] 16 bmp Vt Set:  [430 mL] 430 mL PEEP:  [8 cmH20] 8 cmH20 Plateau Pressure:  [19 cmH20-25 cmH20] 20 cmH20  INTAKE / OUTPUT: Intake/Output     01/23 0701 - 01/24 0700 01/24 0701 - 01/25 0700   I.V. (mL/kg) 589.9 (8.2)    NG/GT 980    IV Piggyback 250    Total Intake(mL/kg) 1819.9 (25.4)    Urine (mL/kg/hr) 2725 (1.6)    Total Output 2725     Net -905.1            PHYSICAL EXAMINATION:  General: Sedated on mechanical ventalation, NAD.  Neuro: Sedated. RASS -2., makes eye contact, does not follow commands HEENT: ETT/OGT in place.  Cardiovascular: RRR, no appreciated murmur Lungs: Coarse breath sounds B/L, no wheezing Abdomen: Soft, +bs  Musculoskeletal: Warm and dry, no edema  LABS:  CBC  Recent Labs Lab 09/28/13 0500 09/29/13 0243 09/30/13 0500  WBC 20.3* 18.9* 16.7*  HGB 9.1* 9.7* 10.4*  HCT 27.7* 29.7* 32.8*  PLT 293 285 338   Coag's No results found for this basename: APTT, INR,  in the last 168 hours BMET  Recent Labs Lab 09/28/13 0500 09/29/13 0243 09/30/13 0500  NA 135* 140 144  K 4.2 4.4 4.2  CL 104 104  107  CO2 21 25 28   BUN 24* 27* 27*  CREATININE 0.66 0.69 0.66  GLUCOSE 196* 243* 103*   Electrolytes  Recent Labs Lab 09/28/13 0500 09/29/13 0243 09/29/13 0500 09/30/13 0500  CALCIUM 8.3* 8.5  --  8.7  MG  --   --  2.2  --    Sepsis Markers  Recent Labs Lab 09/26/13 1105 09/27/13 0500 09/28/13 0500  PROCALCITON 3.42 2.58 1.70   ABG  Recent Labs Lab 09/26/13 0025 09/26/13 1809 09/30/13 0816  PHART 7.317* 7.327* 7.457*  PCO2ART 38.8 31.7* 40.6  PO2ART 104.0* 73.0* 61.0*   Liver Enzymes  Recent Labs Lab 09/25/13 1730 09/28/13 0500  AST 56* 46*  ALT 37* 42*  ALKPHOS 78 68  BILITOT 0.4 0.2*  ALBUMIN 2.3*  1.5*   Cardiac Enzymes  Recent Labs Lab 09/25/13 1719 09/29/13 0535  TROPONINI  --  <0.30  PROBNP 1223.0*  --    Glucose  Recent Labs Lab 09/30/13 0816 09/30/13 1159 09/30/13 1522 09/30/13 1851 09/30/13 2329 10/01/13 0409  GLUCAP 81 149* 137* 147* 157* 142*   CXR:  1/24  Stable airspace disease and bilateral effusions.  ASSESSMENT / PLAN:  PULMONARY A: Acute Respiratory failure CAP, NOS LUL pulmonary nodule w/ mediastinal lymphadenopathy B/L Pleural Effusions P:   -Continue full vent support. Decrease FiO2 as tolerated. -Cont VAP bundle -Daily SBT's as indicated -Cont BDs -Add Mucomyst nebs 1/23 - Diuresis as below, if no improvement with diuresis may need theraputic thoracentesis. - CXR tomorrow am   CARDIOVASCULAR A:  H/o CAD, Takotsubo CMP. EF 50-55% on ECHO H/o AV block, s/p PPM placement Hypotension likely 2/2 septic shock d/t CAP. Improved. P:   -Metoprolol 25 BID   -Continue ASA, Lipitor -check BNP   RENAL A:   AKI. Resolved.  Hyponatremia. Resolved. Stay positive 2 L, Weight 20 lbs over measurement on admission P:   -Trend BMP - Lasix 40mg  IV x 2 doses .   GASTROINTESTINAL A:   GI PPx. Nutrition.  P:   -Continue tube feeds -Protonix 40 qd  HEMATOLOGIC A:   DVT PPx. Leukocytosis. Improving after steroids D/Ced Anemia- stable P:  -Lovenox -Trend CBC  INFECTIOUS A:   CAP. UTI Septic shock; resolved P:   -Abx and cultures as above - tamiflu stopped  ENDOCRINE A:   No h/o DM Glucose control improved P:   -ISS -CBG's q4h - Lantus 10 units  NEUROLOGIC A:   Agitation H/o depression/anxiety P:   -Continue Fentanyl gtt -Versed PRN - Hold Effexor -Neurontin   CC time 35 minutes  Tammy Parrett NP-C  [facility]  [phone]   Baltazar Apo, MD, PhD 10/01/2013, 11:56 AM [facility] [phone] or if no answer [phone]

## 2013-10-02 ENCOUNTER — Inpatient Hospital Stay (HOSPITAL_COMMUNITY): Payer: Medicare HMO

## 2013-10-02 LAB — URINE MICROSCOPIC-ADD ON

## 2013-10-02 LAB — BASIC METABOLIC PANEL
BUN: 36 mg/dL — ABNORMAL HIGH (ref 6–23)
CALCIUM: 9.1 mg/dL (ref 8.4–10.5)
CO2: 33 meq/L — AB (ref 19–32)
Chloride: 106 mEq/L (ref 96–112)
Creatinine, Ser: 0.82 mg/dL (ref 0.50–1.10)
GFR calc Af Amer: 74 mL/min — ABNORMAL LOW (ref >90)
GFR, EST NON AFRICAN AMERICAN: 64 mL/min — AB (ref >90)
GLUCOSE: 117 mg/dL — AB (ref 70–99)
Potassium: 4.1 mEq/L (ref 3.7–5.3)
Sodium: 149 mEq/L — ABNORMAL HIGH (ref 137–147)

## 2013-10-02 LAB — URINALYSIS, ROUTINE W REFLEX MICROSCOPIC
Bilirubin Urine: NEGATIVE
Glucose, UA: NEGATIVE mg/dL
Ketones, ur: NEGATIVE mg/dL
LEUKOCYTES UA: NEGATIVE
Nitrite: NEGATIVE
PH: 5 (ref 5.0–8.0)
Protein, ur: 30 mg/dL — AB
Specific Gravity, Urine: 1.025 (ref 1.005–1.030)
Urobilinogen, UA: 0.2 mg/dL (ref 0.0–1.0)

## 2013-10-02 LAB — CULTURE, BLOOD (ROUTINE X 2)
CULTURE: NO GROWTH
CULTURE: NO GROWTH

## 2013-10-02 LAB — GLUCOSE, CAPILLARY
GLUCOSE-CAPILLARY: 118 mg/dL — AB (ref 70–99)
GLUCOSE-CAPILLARY: 89 mg/dL (ref 70–99)
Glucose-Capillary: 107 mg/dL — ABNORMAL HIGH (ref 70–99)
Glucose-Capillary: 112 mg/dL — ABNORMAL HIGH (ref 70–99)
Glucose-Capillary: 122 mg/dL — ABNORMAL HIGH (ref 70–99)
Glucose-Capillary: 172 mg/dL — ABNORMAL HIGH (ref 70–99)

## 2013-10-02 LAB — CBC
HCT: 32.4 % — ABNORMAL LOW (ref 36.0–46.0)
HEMOGLOBIN: 10.4 g/dL — AB (ref 12.0–15.0)
MCH: 32.5 pg (ref 26.0–34.0)
MCHC: 32.1 g/dL (ref 30.0–36.0)
MCV: 101.3 fL — ABNORMAL HIGH (ref 78.0–100.0)
PLATELETS: 374 10*3/uL (ref 150–400)
RBC: 3.2 MIL/uL — ABNORMAL LOW (ref 3.87–5.11)
RDW: 16.1 % — ABNORMAL HIGH (ref 11.5–15.5)
WBC: 19.4 10*3/uL — ABNORMAL HIGH (ref 4.0–10.5)

## 2013-10-02 LAB — CLOSTRIDIUM DIFFICILE BY PCR: CDIFFPCR: NEGATIVE

## 2013-10-02 MED ORDER — ATROPINE SULFATE 0.1 MG/ML IJ SOLN
INTRAMUSCULAR | Status: AC
  Filled 2013-10-02: qty 10

## 2013-10-02 MED ORDER — FREE WATER
250.0000 mL | Freq: Four times a day (QID) | Status: DC
  Administered 2013-10-02 – 2013-10-04 (×7): 250 mL

## 2013-10-02 MED ORDER — FUROSEMIDE 10 MG/ML IJ SOLN
40.0000 mg | Freq: Once | INTRAMUSCULAR | Status: AC
  Administered 2013-10-02: 40 mg via INTRAVENOUS
  Filled 2013-10-02: qty 4

## 2013-10-02 NOTE — Progress Notes (Signed)
PULMONARY/CRITICAL CARE MEDICINE   Name: [name] MRN: [id number] DOB: Aug 07, 1927    ADMISSION DATE:  09/14/2013 CONSULTATION DATE:  09/25/13  REFERRING MD :  Grandville Silos PRIMARY SERVICE: PCCM  CHIEF COMPLAINT: Abnormal CT, pneumonia, hypoxia  BRIEF PATIENT DESCRIPTION: 80 y/o female never smoker with PMHx of CAD, takotsubo cardiomyopathy, HTN, AV block s/p PPM placement, presented 1/14 with 4 day history of cough, congestion, fever, malaise. She was admitted and also found to have UTI. Treated initially with rocephin, azithro and empiric tamiflu (although flu neg), switched to Levaquin. Patient continued to have hypoxia, CT chest showed LUL pulm nodule and PCCM consulted.   SIGNIFICANT EVENTS / STUDIES:  1/17 CT chest:  No PE, peribronchovascular soft tissue thickening extending from the left and right hilum into the lower lobes, Left upper lobe pulmonary nodule and mediastinal lymphadenopathy, BLL consolidation.  1/21 FOB: No masses noted. BI, RLL, RML airways markedly edematous and erythematous. Resp cx, AFB, cytology on washings 1/25 Recurrence of fever   LINES / TUBES: ETT 1/19 >>> OGT 1/19 >>>1/24, 1/24>> RIJ CVL 1/19 >>>   CULTURES: Urine 1/13>>> pseudomonas (pan sens)  BCx2 1/14>>> Neg Urine legionella 1/14>>> NEG  Urine strep 1/14>>> NEG  Flu PCR 1/14>> NEG  MRSA 1/18 >>> NEG Blood 1/19 >> NGTD Resp (bronch) 1/21 >> candida 50K AFB (bronch) 1/21 >> neg prelim  ANTIBIOTICS: Azithro 1/14 >>> 1/14 Rocephin 1/14 >>> 1/14 Levaquin 1/16 >>>1/18 Zosyn 1/18 >>> Vancomycin 1/18 >>> Tamiflu 1/15 >>> 1/19   SUBJECTIVE:  The patient had increased agitation overnight, with self-removal of OG tube, requiring OG tube replacement and frequent Versed.  The patient spiked a fever of 101.2 overnight, though WBC remains relatively unchanged.  Net negative I/O's yesterday, though BUN rose slightly.  VITAL SIGNS: Temp:  [98.7 F (37.1 C)-101.2 F (38.4 C)] 99.3 F (37.4  C) (01/25 0424) Pulse Rate:  [79-133] 89 (01/25 0300) Resp:  [16-27] 17 (01/25 0300) BP: (89-157)/(40-70) 101/47 mmHg (01/25 0300) SpO2:  [91 %-97 %] 92 % (01/25 0300) FiO2 (%):  [40 %] 40 % (01/25 0340) Weight:  [156 lb 12 oz (71.1 kg)] 156 lb 12 oz (71.1 kg) (01/25 0500)  HEMODYNAMICS:    VENTILATOR SETTINGS: Vent Mode:  [-] PRVC FiO2 (%):  [40 %] 40 % Set Rate:  [16 bmp] 16 bmp Vt Set:  [430 mL] 430 mL PEEP:  [8 cmH20] 8 cmH20 Plateau Pressure:  [19 cmH20-23 cmH20] 21 cmH20  INTAKE / OUTPUT: Intake/Output     01/24 0701 - 01/25 0700   I.V. (mL/kg) 740 (10.4)   NG/GT 680   IV Piggyback 125   Total Intake(mL/kg) 1545 (21.7)   Urine (mL/kg/hr) 2525 (1.5)   Total Output 2525   Net -980         PHYSICAL EXAMINATION:  General: lying in bed, intubated, sedated HEENT: PERRL, EOMI, ETT in place Neck: supple Lungs: mechanical breath sounds bilaterally, no wheezing Heart: regular rate and rhythm, no murmurs, gallops, or rubs Abdomen: soft, non-tender, non-distended, normal bowel sounds Extremities: no cyanosis, clubbing, or edema Neurologic: RASS = -3 after recent versed  LABS:  CBC  Recent Labs Lab 09/30/13 0500 10/01/13 0830 10/02/13 0400  WBC 16.7* 18.9* 19.4*  HGB 10.4* 11.0* 10.4*  HCT 32.8* 33.4* 32.4*  PLT 338 369 374   Coag's No results found for this basename: APTT, INR,  in the last 168 hours BMET  Recent Labs Lab 09/30/13 0500 10/01/13 0830 10/02/13 0400  NA 144 144 149*  K 4.2 4.5 4.1  CL 107 105 106  CO2 28 30 33*  BUN 27* 31* 36*  CREATININE 0.66 0.77 0.82  GLUCOSE 103* 105* 117*   Electrolytes  Recent Labs Lab 09/29/13 0243 09/29/13 0500 09/30/13 0500 10/01/13 0830 10/02/13 0400  CALCIUM 8.5  --  8.7 9.1 9.1  MG  --  2.2  --   --   --    Sepsis Markers  Recent Labs Lab 09/26/13 1105 09/27/13 0500 09/28/13 0500  PROCALCITON 3.42 2.58 1.70   ABG  Recent Labs Lab 09/26/13 0025 09/26/13 1809 09/30/13 0816  PHART  7.317* 7.327* 7.457*  PCO2ART 38.8 31.7* 40.6  PO2ART 104.0* 73.0* 61.0*   Liver Enzymes  Recent Labs Lab 09/25/13 1730 09/28/13 0500  AST 56* 46*  ALT 37* 42*  ALKPHOS 78 68  BILITOT 0.4 0.2*  ALBUMIN 2.3* 1.5*   Cardiac Enzymes  Recent Labs Lab 09/25/13 1719 09/29/13 0535 10/01/13 0830  TROPONINI  --  <0.30  --   PROBNP 1223.0*  --  1790.0*   Glucose  Recent Labs Lab 10/01/13 0706 10/01/13 1107 10/01/13 1512 10/01/13 1910 10/01/13 2357 10/02/13 0341  GLUCAP 127* 105* 110* 152* 172* 122*   CXR:  1/25  Continued bilateral edema  ASSESSMENT / PLAN:  PULMONARY A: Acute Respiratory failure CAP LUL pulmonary nodule w/ mediastinal lymphadenopathy B/L Pleural Effusions P:   -Continue full vent support. -Cont VAP bundle -Daily SBT's -Cont BDs -continue Mucomyst nebs BID (started 1/23) - Diuresis as below, if no improvement with diuresis may need theraputic thoracentesis (although effusions are small). - CXR tomorrow am  CARDIOVASCULAR A:  H/o CAD, Takotsubo CMP. EF 50-55% on ECHO H/o AV block, s/p PPM placement Hypotension likely 2/2 septic shock d/t CAP. Improved. P:   -Metoprolol 25 BID   -Continue ASA, Lipitor -check BNP   RENAL A:   AKI. Resolved.  Hypernatremia P:   -Trend BMP - lasix 40 IV once today -start free water flushes 200 q8  GASTROINTESTINAL A:   GI PPx. Nutrition.  P:   -Restart tube feeds after KUB confirms OG tube placement -Protonix 40 qd  HEMATOLOGIC A:   DVT PPx. Leukocytosis. Improving after steroids D/Ced Anemia- stable P:  -Lovenox -Trend CBC  INFECTIOUS A:   CAP. UTI Septic shock; resolved P:   -Abx and cultures as above  ENDOCRINE A:   No h/o DM Glucose control improved P:   -SSI -CBG's q4h - Lantus 10 units  NEUROLOGIC A:   Agitation H/o depression/anxiety P:   - fentanyl, versed for pain/sedation.  Despite agitation last night, we don't want to over-sedate the patient, which could  worsen her respiratory status, slow down weaning efforts - Hold Effexor -Neurontin    Elnora Morrison, PGY3 Pgr. [phone] 10/02/2013, 7:05 AM  35 min CC time  Baltazar Apo, MD, PhD 10/02/2013, 12:55 PM [facility] [phone] or if no answer [phone]

## 2013-10-03 ENCOUNTER — Inpatient Hospital Stay (HOSPITAL_COMMUNITY): Payer: Medicare HMO

## 2013-10-03 DIAGNOSIS — J984 Other disorders of lung: Secondary | ICD-10-CM

## 2013-10-03 DIAGNOSIS — Z950 Presence of cardiac pacemaker: Secondary | ICD-10-CM

## 2013-10-03 LAB — CBC
HCT: 36.3 % (ref 36.0–46.0)
HEMOGLOBIN: 11.4 g/dL — AB (ref 12.0–15.0)
MCH: 32.6 pg (ref 26.0–34.0)
MCHC: 31.4 g/dL (ref 30.0–36.0)
MCV: 103.7 fL — ABNORMAL HIGH (ref 78.0–100.0)
Platelets: 450 10*3/uL — ABNORMAL HIGH (ref 150–400)
RBC: 3.5 MIL/uL — AB (ref 3.87–5.11)
RDW: 16.6 % — ABNORMAL HIGH (ref 11.5–15.5)
WBC: 17.2 10*3/uL — ABNORMAL HIGH (ref 4.0–10.5)

## 2013-10-03 LAB — FUNGAL ANTIBODIES PANEL, ID-BLOOD
ASPERGILLUS NIGER ANTIBODIES: NEGATIVE
Aspergillus Flavus Antibodies: NEGATIVE
Aspergillus fumigatus: NEGATIVE
Blastomyces Abs, Qn, DID: NEGATIVE
Coccidioides Antibody ID: NEGATIVE
Histoplasma Ab, Immunodiffusion: NEGATIVE

## 2013-10-03 LAB — BASIC METABOLIC PANEL
BUN: 36 mg/dL — ABNORMAL HIGH (ref 6–23)
CO2: 33 mEq/L — ABNORMAL HIGH (ref 19–32)
Calcium: 9.1 mg/dL (ref 8.4–10.5)
Chloride: 103 mEq/L (ref 96–112)
Creatinine, Ser: 0.82 mg/dL (ref 0.50–1.10)
GFR calc non Af Amer: 64 mL/min — ABNORMAL LOW (ref >90)
GFR, EST AFRICAN AMERICAN: 74 mL/min — AB (ref >90)
Glucose, Bld: 103 mg/dL — ABNORMAL HIGH (ref 70–99)
POTASSIUM: 3.9 meq/L (ref 3.7–5.3)
SODIUM: 149 meq/L — AB (ref 137–147)

## 2013-10-03 LAB — GLUCOSE, CAPILLARY
GLUCOSE-CAPILLARY: 63 mg/dL — AB (ref 70–99)
GLUCOSE-CAPILLARY: 142 mg/dL — AB (ref 70–99)
Glucose-Capillary: 145 mg/dL — ABNORMAL HIGH (ref 70–99)
Glucose-Capillary: 151 mg/dL — ABNORMAL HIGH (ref 70–99)
Glucose-Capillary: 169 mg/dL — ABNORMAL HIGH (ref 70–99)
Glucose-Capillary: 85 mg/dL (ref 70–99)
Glucose-Capillary: 95 mg/dL (ref 70–99)

## 2013-10-03 LAB — URINE CULTURE
CULTURE: NO GROWTH
Colony Count: NO GROWTH

## 2013-10-03 LAB — POCT I-STAT 3, ART BLOOD GAS (G3+)
ACID-BASE EXCESS: 10 mmol/L — AB (ref 0.0–2.0)
BICARBONATE: 35.4 meq/L — AB (ref 20.0–24.0)
O2 Saturation: 86 %
PO2 ART: 54 mmHg — AB (ref 80.0–100.0)
Patient temperature: 100.3
TCO2: 37 mmol/L (ref 0–100)
pCO2 arterial: 52.3 mmHg — ABNORMAL HIGH (ref 35.0–45.0)
pH, Arterial: 7.442 (ref 7.350–7.450)

## 2013-10-03 LAB — HYPERSENSITIVITY PNUEMONITIS PROFILE

## 2013-10-03 MED ORDER — DEXTROSE 50 % IV SOLN
INTRAVENOUS | Status: AC
  Filled 2013-10-03: qty 50

## 2013-10-03 MED ORDER — ACETAMINOPHEN 325 MG PO TABS: 650.0000 mg | ORAL_TABLET | Freq: Four times a day (QID) | ORAL | Status: DC | PRN

## 2013-10-03 MED ORDER — DEXTROSE 5 % IV SOLN
1.0000 g | Freq: Two times a day (BID) | INTRAVENOUS | Status: DC
  Administered 2013-10-03 (×2): 1 g via INTRAVENOUS
  Filled 2013-10-03 (×4): qty 1

## 2013-10-03 MED ORDER — VITAL AF 1.2 CAL PO LIQD
1000.0000 mL | ORAL | Status: DC
  Administered 2013-10-03 – 2013-10-04 (×2): 1000 mL
  Filled 2013-10-03 (×4): qty 1000

## 2013-10-03 MED ORDER — FUROSEMIDE 10 MG/ML IJ SOLN
40.0000 mg | Freq: Once | INTRAMUSCULAR | Status: AC
  Administered 2013-10-03: 40 mg via INTRAVENOUS
  Filled 2013-10-03: qty 4

## 2013-10-03 MED ORDER — VANCOMYCIN HCL IN DEXTROSE 750-5 MG/150ML-% IV SOLN
750.0000 mg | Freq: Two times a day (BID) | INTRAVENOUS | Status: DC
  Administered 2013-10-03 (×2): 750 mg via INTRAVENOUS
  Filled 2013-10-03 (×4): qty 150

## 2013-10-03 MED ORDER — POTASSIUM CHLORIDE 20 MEQ/15ML (10%) PO LIQD
20.0000 meq | Freq: Once | ORAL | Status: AC
  Administered 2013-10-03: 20 meq via ORAL
  Filled 2013-10-03: qty 15

## 2013-10-03 MED ORDER — DEXTROSE 50 % IV SOLN
25.0000 mL | Freq: Once | INTRAVENOUS | Status: AC | PRN
  Administered 2013-10-03: 25 mL via INTRAVENOUS

## 2013-10-03 MED ORDER — ACETAMINOPHEN 160 MG/5ML PO SOLN
650.0000 mg | Freq: Four times a day (QID) | ORAL | Status: DC | PRN
  Administered 2013-10-03: 650 mg via ORAL
  Filled 2013-10-03: qty 20.3

## 2013-10-03 NOTE — Progress Notes (Signed)
Chaplain followed up with family, offering spiritual and emotional support. Family is together for a goals of care meeting with physician at some point today. Pt's daughter and granddaughter said they feel "not good" about the meeting. Family appears anxious by pt's condition. Family minister was present and said he would remain for family meeting. Chaplain provided empathic listening, emotional and spiritual support, and caring presence.   Please page if needed.   [facility]: [phone]

## 2013-10-03 NOTE — Progress Notes (Addendum)
GOALS OF CARE DISCUSSION  Dr Titus Mould had 50 minute discussion today with patient's family including patient's daughter Chauncey Reading).  They were informed of patient's poor prognosis given her lack of improvement and increased ventilator requirements.  It was discussed that although we do not have a tissue diagnosis of cancer, the pulmonary nodule in her lungs, and lymph node enlargement may represent cancer and even with a rapid improvement she may have a poor prognosis.  They report she would likely not want any further workup of cancer if she recovered, would not want to be placed in a rehab facility, and would not want a tracheostomy. It was agreed that we will continue the current medical interventions for another 48 hours to see if she makes progress, however if she were to deteriorate they would not want escalation of care with medications to support blood pressure. If she does not show improvement in this time they support comfort care measures and withdraw of life support therapy. They reiterated that if her heart were to stop they would not want any ACLS measures taken.  Joni Reining, DO  As above, 55 min ccm time additional  .Lavon Paganini. Titus Mould, MD, [place] Pgr: [facility]

## 2013-10-03 NOTE — Progress Notes (Signed)
ANTIBIOTIC CONSULT NOTE - INITIAL  Pharmacy Consult for Vancomycin and Ceftazidime Indication: r/o VAP  Allergies  Allergen Reactions  . Codeine Other (See Comments)    Unsure of reaction    Patient Measurements: Height: 5\' 4"  (162.6 cm) Weight: 159 lb 6.3 oz (72.3 kg) IBW/kg (Calculated) : 54.7  Vital Signs: Temp: 100.3 F (37.9 C) (01/26 0752) Temp src: Oral (01/26 0752) BP: 109/52 mmHg (01/26 0844) Pulse Rate: 96 (01/26 0844) Intake/Output from previous day: 01/25 0701 - 01/26 0700 In: 1403.7 [I.V.:860; NG/GT:393.7; IV Piggyback:150] Out: 2355 [Urine:2355] Intake/Output from this shift: Total I/O In: 300 [I.V.:20; NG/GT:280] Out: 175 [Urine:175]  Labs:  Recent Labs  10/01/13 0830 10/02/13 0400 10/03/13 0500  WBC 18.9* 19.4* 17.2*  HGB 11.0* 10.4* 11.4*  PLT 369 374 450*  CREATININE 0.77 0.82 0.82   Estimated Creatinine Clearance: 49.7 ml/min (by C-G formula based on Cr of 0.82). No results found for this basename: VANCOTROUGH, Corlis Leak, VANCORANDOM, [place], [place], [place], [place], [place], TOBRARND, AMIKACINPEAK, AMIKACINTROU, AMIKACIN,  in the last 72 hours   Microbiology: Recent Results (from the past 720 hour(s))  URINE CULTURE     Status: None   Collection Time    10/05/2013 11:05 PM      Result Value Range Status   Specimen Description URINE, CLEAN CATCH   Final   Special Requests NONE   Final   Culture  Setup Time     Final   Value: 09/21/2013 06:13     Performed at [facility]     Final   Value: 10,000 COLONIES/ML     Performed at [facility]   Culture     Final   Value: PSEUDOMONAS AERUGINOSA     Performed at [facility]   Report Status 09/24/2013 FINAL   Final   Organism ID, Bacteria PSEUDOMONAS AERUGINOSA   Final  CULTURE, BLOOD (ROUTINE X 2)     Status: None   Collection Time    09/21/13 12:15 AM      Result Value Range Status   Specimen Description BLOOD LEFT ANTECUBITAL    Final   Special Requests     Final   Value: BOTTLES DRAWN AEROBIC AND ANAEROBIC AER 5cc ANA 5cc   Culture  Setup Time     Final   Value: 09/21/2013 02:20     Performed at [facility]   Culture     Final   Value: NO GROWTH 5 DAYS     Performed at [facility]   Report Status 09/27/2013 FINAL   Final  CULTURE, BLOOD (ROUTINE X 2)     Status: None   Collection Time    09/21/13 12:15 AM      Result Value Range Status   Specimen Description BLOOD RIGHT ANTECUBITAL   Final   Special Requests     Final   Value: BOTTLES DRAWN AEROBIC AND ANAEROBIC AER 5cc ANA 5cc   Culture  Setup Time     Final   Value: 09/21/2013 02:20     Performed at [facility]   Culture     Final   Value: NO GROWTH 5 DAYS     Performed at [facility]   Report Status 09/27/2013 FINAL   Final  CULTURE, BLOOD (ROUTINE X 2)     Status: None   Collection Time    09/21/13  6:10 AM      Result Value Range Status  Specimen Description BLOOD RIGHT ARM   Final   Special Requests BOTTLES DRAWN AEROBIC AND ANAEROBIC 10CC EACH   Final   Culture  Setup Time     Final   Value: 09/21/2013 12:14     Performed at [facility]   Culture     Final   Value: NO GROWTH 5 DAYS     Performed at [facility]   Report Status 09/27/2013 FINAL   Final  CULTURE, BLOOD (ROUTINE X 2)     Status: None   Collection Time    09/21/13  6:18 AM      Result Value Range Status   Specimen Description BLOOD RIGHT ARM   Final   Special Requests BOTTLES DRAWN AEROBIC AND ANAEROBIC 10CC EACH   Final   Culture  Setup Time     Final   Value: 09/21/2013 12:14     Performed at [facility]   Culture     Final   Value: NO GROWTH 5 DAYS     Performed at [facility]   Report Status 09/27/2013 FINAL   Final  MRSA PCR SCREENING     Status: None   Collection Time    09/25/13  7:29 PM      Result Value Range Status   MRSA by PCR NEGATIVE  NEGATIVE Final   Comment:            The  GeneXpert MRSA Assay (FDA     approved for NASAL specimens     only), is one component of a     comprehensive MRSA colonization     surveillance program. It is not     intended to diagnose MRSA     infection nor to guide or     monitor treatment for     MRSA infections.  CULTURE, BLOOD (ROUTINE X 2)     Status: None   Collection Time    09/26/13 12:30 AM      Result Value Range Status   Specimen Description BLOOD CENTRAL LINE   Final   Special Requests BOTTLES DRAWN AEROBIC AND ANAEROBIC 5CC EA   Final   Culture  Setup Time     Final   Value: 09/26/2013 08:51     Performed at [facility]   Culture     Final   Value: NO GROWTH 5 DAYS     Performed at [facility]   Report Status 10/02/2013 FINAL   Final  CULTURE, BLOOD (ROUTINE X 2)     Status: None   Collection Time    09/26/13  7:15 AM      Result Value Range Status   Specimen Description BLOOD RIGHT ARM   Final   Special Requests BOTTLES DRAWN AEROBIC ONLY 5CC   Final   Culture  Setup Time     Final   Value: 09/26/2013 12:49     Performed at [facility]   Culture     Final   Value: NO GROWTH 5 DAYS     Performed at [facility]   Report Status 10/02/2013 FINAL   Final  AFB CULTURE WITH SMEAR     Status: None   Collection Time    09/28/13 12:28 PM      Result Value Range Status   Specimen Description BRONCHIAL ALVEOLAR LAVAGE   Final   Special Requests NONE   Final   ACID FAST SMEAR     Final  Value: NO ACID FAST BACILLI SEEN     Performed at [facility]   Culture     Final   Value: CULTURE WILL BE EXAMINED FOR 6 WEEKS BEFORE ISSUING A FINAL REPORT     Performed at [facility]   Report Status PENDING   Incomplete  CULTURE, BAL-QUANTITATIVE     Status: None   Collection Time    09/28/13 12:28 PM      Result Value Range Status   Specimen Description BRONCHIAL ALVEOLAR LAVAGE   Final   Special Requests NONE   Final   Gram Stain     Final   Value: RARE WBC  PRESENT,BOTH PMN AND MONONUCLEAR     NO SQUAMOUS EPITHELIAL CELLS SEEN     NO ORGANISMS SEEN     Performed at [facility]     Final   Value: 50,000 COLONIES/ML     Performed at [facility]   Culture     Final   Value: CANDIDA ALBICANS     Performed at [facility]   Report Status 09/30/2013 FINAL   Final  CULTURE, RESPIRATORY (NON-EXPECTORATED)     Status: None   Collection Time    09/28/13 12:29 PM      Result Value Range Status   Specimen Description BRONCHIAL ALVEOLAR LAVAGE   Final   Special Requests NONE   Final   Gram Stain     Final   Value: RARE WBC PRESENT,BOTH PMN AND MONONUCLEAR     NO SQUAMOUS EPITHELIAL CELLS SEEN     NO ORGANISMS SEEN     Performed at [facility]   Culture     Final   Value: FEW CANDIDA ALBICANS     Performed at [facility]   Report Status 09/30/2013 FINAL   Final  CULTURE, BLOOD (ROUTINE X 2)     Status: None   Collection Time    10/02/13  9:00 AM      Result Value Range Status   Specimen Description BLOOD RIGHT ARM   Final   Special Requests BOTTLES DRAWN AEROBIC AND ANAEROBIC 5CC EACH   Final   Culture  Setup Time     Final   Value: 10/02/2013 15:59     Performed at [facility]   Culture     Final   Value:        BLOOD CULTURE RECEIVED NO GROWTH TO DATE CULTURE WILL BE HELD FOR 5 DAYS BEFORE ISSUING A FINAL NEGATIVE REPORT     Performed at [facility]   Report Status PENDING   Incomplete  CULTURE, BLOOD (ROUTINE X 2)     Status: None   Collection Time    10/02/13  9:15 AM      Result Value Range Status   Specimen Description BLOOD RIGHT HAND   Final   Special Requests BOTTLES DRAWN AEROBIC ONLY 10CC   Final   Culture  Setup Time     Final   Value: 10/02/2013 15:59     Performed at [facility]   Culture     Final   Value:        BLOOD CULTURE RECEIVED NO GROWTH TO DATE CULTURE WILL BE HELD FOR 5 DAYS BEFORE ISSUING A FINAL NEGATIVE REPORT      Performed at [facility]   Report Status PENDING   Incomplete  CLOSTRIDIUM DIFFICILE BY PCR  Status: None   Collection Time    10/02/13  1:58 PM      Result Value Range Status   C difficile by pcr NEGATIVE  NEGATIVE Final    Medical History: Past Medical History  Diagnosis Date  . Hemorrhoids   . Hypertension   . Anemia   . Diverticulitis   . Hyperlipidemia   . Depression   . Anxiety   . CAD (coronary artery disease)      a. NSTEMI 7/12 with cath: dLM 20%, mLAD 60%, dLAD 30%, Dx 40%, mCFX at AV groove 30%, prox to mid RCA 30-40%, PDA 50%, EF 35% with apical ballooning and DK of the apex;      . Takotsubo cardiomyopathy     a. echo 7/12:  EF 35%, inf-sep, AS and inf HK, mild LVH, mild MR;  b. echo 10/12: EF 60%, mild LAE  . Syncope     2/2 mobitz 2  . Mobitz type 2 second degree atrioventricular block   . LBBB (left bundle branch block)   . Biventricular cardiac pacemaker in situ     [employer] Jude    Medications:  Scheduled:  . albuterol  2.5 mg Nebulization Q6H  . antiseptic oral rinse  15 mL Mouth Rinse QID  . aspirin  81 mg Oral Daily  . atorvastatin  5 mg Oral Daily  . chlorhexidine  15 mL Mouth Rinse BID  . dextrose      . enoxaparin (LOVENOX) injection  40 mg Subcutaneous Daily  . feeding supplement (PRO-STAT SUGAR FREE 64)  30 mL Per Tube BID  . free water  250 mL Per Tube Q6H  . furosemide  40 mg Intravenous Once  . gabapentin  200 mg Oral Q8H  . insulin aspart  0-9 Units Subcutaneous Q4H  . insulin glargine  10 Units Subcutaneous Daily  . lidocaine  10 mL Other Once  . metoprolol tartrate  25 mg Per Tube Q12H  . pantoprazole sodium  40 mg Per Tube Daily  . piperacillin-tazobactam (ZOSYN)  IV  3.375 g Intravenous Q8H  . polyethylene glycol  17 g Per Tube Daily  . potassium chloride  20 mEq Oral Once   Assessment: 80 yo F presented initially w/ CAP received 8 days of Zosyn. Patient was intubated on 1/18, now presenting with spike in fever overnight,  and elevated WBC. Patients renal function stable at 0.82.   1/25 Cdiff >> neg 1/25 Urine >>  pending 1/25 BCx>> ngtd 1/21 AFB >> neg 1/21 BAL >> 50K candida albicans 1/19 Bld x2>> neg 1/13 Urine>> pseudomonas (pan S) 1/14 Bld x 4>>neg 1/14 Flu neg  Goal of Therapy:  Vancomycin trough level 15-20 mcg/ml  Plan:  -Start Vancomycin 750mg  IV q12h, check VT prior to 4th dose -Start Ceftazdime 1g IV q12h - Monitor clinical progression, cultures, and renal function  Posey Pronto, Sharleen Szczesny M 10/03/2013,10:43 AM

## 2013-10-03 NOTE — Progress Notes (Addendum)
NUTRITION FOLLOW UP  Intervention:    Change TF to a less concentrated formula: Vital AF 1.2 at 50 ml/h to provide 1440 kcals, 90 gm protein, 973 ml free water daily.  Nutrition Dx:   Inadequate oral intake related to inability to eat as evidenced by NPO status. Ongoing.  Goal:   Intake to meet >90% of estimated nutrition needs. Met.  Monitor:   TF tolerance/adequacy, weight trend, labs, vent status.  Assessment:   Patient is a 80 yo female with 4 days of cough, nasal congestion, low grade temp , not feeling well, not eating well. Went to see pcp several days ago diagnosed with a viral syndrome, flu not checked. Patient with a hx CAD, takotsubo cardiomyopathy, HTN admitted 1/14 by [facility] with CAP and UTI. Cont to have hypoxia and CT chest showed LUL pulmonary nodule.  Patient is currently receiving TF via OGT with tip in the stomach. Oxepa at 40 ml/h with Prostat 30 ml BID to provide 1640 kcals, 90 gm protein, 754 ml free water daily. Free water flushes: 250 ml every 6 hours.  S/P family meeting earlier today with plans to continue current care for another 48 hours, then re-assess and potentially transition to comfort care if no improvement.  Patient is currently intubated on ventilator support.  MV: 7 L/min Temp (24hrs), Avg:99.3 F (37.4 C), Min:98.4 F (36.9 C), Max:100.3 F (37.9 C)   Height: Ht Readings from Last 1 Encounters:  09/26/13 $RemoveB'5\' 4"'[id number]$  (1.626 m)    Weight Status:   Wt Readings from Last 1 Encounters:  10/03/13 159 lb 6.3 oz (72.3 kg)  09/26/13  144 lb 10 oz (65.6 kg)   Re-estimated needs:  Kcal: 1450 Protein: 80-95 gm Fluid: 1.4-1.6 L  Skin: no wounds  Diet Order: NPO   Intake/Output Summary (Last 24 hours) at 10/03/13 1055 Last data filed at 10/03/13 0816  Gross per 24 hour  Intake 1513.67 ml  Output   2030 ml  Net -516.33 ml    Last BM: 1/26   Labs:   Recent Labs Lab 09/29/13 0243 09/29/13 0500  10/01/13 0830 10/02/13 0400  10/03/13 0500  NA 140  --   < > 144 149* 149*  K 4.4  --   < > 4.5 4.1 3.9  CL 104  --   < > 105 106 103  CO2 25  --   < > 30 33* 33*  BUN 27*  --   < > 31* 36* 36*  CREATININE 0.69  --   < > 0.77 0.82 0.82  CALCIUM 8.5  --   < > 9.1 9.1 9.1  MG  --  2.2  --   --   --   --   GLUCOSE 243*  --   < > 105* 117* 103*  < > = values in this interval not displayed.  CBG (last 3)   Recent Labs  10/03/13 0334 10/03/13 0744 10/03/13 0925  GLUCAP 85 63* 145*    Scheduled Meds: . albuterol  2.5 mg Nebulization Q6H  . antiseptic oral rinse  15 mL Mouth Rinse QID  . aspirin  81 mg Oral Daily  . atorvastatin  5 mg Oral Daily  . cefTAZidime (FORTAZ)  IV  1 g Intravenous Q12H  . chlorhexidine  15 mL Mouth Rinse BID  . dextrose      . enoxaparin (LOVENOX) injection  40 mg Subcutaneous Daily  . feeding supplement (PRO-STAT SUGAR FREE 64)  30 mL Per Tube  BID  . free water  250 mL Per Tube Q6H  . furosemide  40 mg Intravenous Once  . gabapentin  200 mg Oral Q8H  . insulin aspart  0-9 Units Subcutaneous Q4H  . insulin glargine  10 Units Subcutaneous Daily  . lidocaine  10 mL Other Once  . metoprolol tartrate  25 mg Per Tube Q12H  . pantoprazole sodium  40 mg Per Tube Daily  . polyethylene glycol  17 g Per Tube Daily  . potassium chloride  20 mEq Oral Once  . vancomycin  750 mg Intravenous Q12H    Continuous Infusions: . sodium chloride 20 mL/hr at 09/30/13 2045  . feeding supplement (OXEPA) 1,000 mL (10/03/13 0816)  . fentaNYL infusion INTRAVENOUS 250 mcg/hr (10/03/13 0842)     Molli Barrows, RD, LDN, [place] Pager [phone] After Hours Pager [phone]

## 2013-10-03 NOTE — Progress Notes (Signed)
PULMONARY/CRITICAL CARE MEDICINE   Name: [name] MRN: [id number] DOB: [birth year]    ADMISSION DATE:  10/01/2013 CONSULTATION DATE:  09/25/13  REFERRING MD :  Grandville Silos PRIMARY SERVICE: PCCM  CHIEF COMPLAINT: Abnormal CT, pneumonia, hypoxia  BRIEF PATIENT DESCRIPTION: 80 y/o female never smoker with PMHx of CAD, takotsubo cardiomyopathy, HTN, AV block s/p PPM placement, presented 1/14 with 4 day history of cough, congestion, fever, malaise. She was admitted and also found to have UTI. Treated initially with rocephin, azithro and empiric tamiflu (although flu neg), switched to Levaquin. Patient continued to have hypoxia, CT chest showed LUL pulm nodule and PCCM consulted.   SIGNIFICANT EVENTS / STUDIES:  1/17 CT chest:  No PE, peribronchovascular soft tissue thickening extending from the left and right hilum into the lower lobes, Left upper lobe pulmonary nodule and mediastinal lymphadenopathy, BLL consolidation.  1/21 FOB: No masses noted. BI, RLL, RML airways markedly edematous and erythematous. Resp cx, AFB, cytology on washings 1/25 Recurrence of fever   LINES / TUBES: ETT 1/19 >>> OGT 1/19 >>>1/24, 1/24>> RIJ CVL 1/19 >>>   CULTURES: Urine 1/13>>> pseudomonas (pan sens)  BCx2 1/14>>> Neg Urine legionella 1/14>>> NEG  Urine strep 1/14>>> NEG  Flu PCR 1/14>> NEG  MRSA 1/18 >>> NEG Blood 1/19 >> neg Resp (bronch) 1/21 >> candida 50K AFB (bronch) 1/21 >> neg prelim  ANTIBIOTICS: Azithro 1/14 >>> 1/14 Rocephin 1/14 >>> 1/14 Levaquin 1/16 >>>1/18 Zosyn 1/18 >>> Vancomycin 1/18 >>>1/23 Tamiflu 1/15 >>> 1/19  SUBJECTIVE:  Tmax 100.3 overnight.  Patient intermittently restless, agitated overnight.  Occasionally O2 Sats drop, not good waveform on monitor.  VITAL SIGNS: Temp:  [98.4 F (36.9 C)-100.3 F (37.9 C)] 100.3 F (37.9 C) (01/26 0430) Pulse Rate:  [71-111] 111 (01/26 0501) Resp:  [15-22] 17 (01/26 0200) BP: (94-146)/(46-71) 141/71 mmHg (01/26  0501) SpO2:  [87 %-98 %] 92 % (01/26 0200) FiO2 (%):  [40 %] 40 % (01/26 0300) Weight:  [159 lb 6.3 oz (72.3 kg)] 159 lb 6.3 oz (72.3 kg) (01/26 0500)  HEMODYNAMICS:    VENTILATOR SETTINGS: Vent Mode:  [-] PRVC FiO2 (%):  [40 %] 40 % Set Rate:  [16 bmp] 16 bmp Vt Set:  [430 mL] 430 mL PEEP:  [8 cmH20] 8 cmH20 Plateau Pressure:  [19 cmH20-22 cmH20] 21 cmH20  INTAKE / OUTPUT: Intake/Output     01/25 0701 - 01/26 0700   I.V. (mL/kg) 660 (9.1)   NG/GT 393.7   IV Piggyback 100   Total Intake(mL/kg) 1153.7 (16)   Urine (mL/kg/hr) 1930 (1.1)   Total Output 1930   Net -776.3         PHYSICAL EXAMINATION:  General: lying in bed, intubated, sedated Neuro: Sedated RASS -2 HEENT: ETT in place Neck: supple Lungs: mechanical breath sounds bilaterally, no wheezing, decreased bibasilar breath sounds Heart: regular rate and rhythm, no murmurs, gallops, or rubs Abdomen: soft, non-tender, non-distended, normal bowel sounds Extremities: no pedal edema  LABS:  CBC  Recent Labs Lab 10/01/13 0830 10/02/13 0400 10/03/13 0500  WBC 18.9* 19.4* 17.2*  HGB 11.0* 10.4* 11.4*  HCT 33.4* 32.4* 36.3  PLT 369 374 450*   Coag's No results found for this basename: APTT, INR,  in the last 168 hours BMET  Recent Labs Lab 10/01/13 0830 10/02/13 0400 10/03/13 0500  NA 144 149* 149*  K 4.5 4.1 3.9  CL 105 106 103  CO2 30 33* 33*  BUN 31* 36* 36*  CREATININE 0.77 0.82 0.82  GLUCOSE 105* 117* 103*   Electrolytes  Recent Labs Lab 09/29/13 0243 09/29/13 0500  10/01/13 0830 10/02/13 0400 10/03/13 0500  CALCIUM 8.5  --   < > 9.1 9.1 9.1  MG  --  2.2  --   --   --   --   < > = values in this interval not displayed. Sepsis Markers  Recent Labs Lab 09/26/13 1105 09/27/13 0500 09/28/13 0500  PROCALCITON 3.42 2.58 1.70   ABG  Recent Labs Lab 09/26/13 1809 09/30/13 0816  PHART 7.327* 7.457*  PCO2ART 31.7* 40.6  PO2ART 73.0* 61.0*   Liver Enzymes  Recent Labs Lab  09/28/13 0500  AST 46*  ALT 42*  ALKPHOS 68  BILITOT 0.2*  ALBUMIN 1.5*   Cardiac Enzymes  Recent Labs Lab 09/29/13 0535 10/01/13 0830  TROPONINI <0.30  --   PROBNP  --  1790.0*   Glucose  Recent Labs Lab 10/02/13 0702 10/02/13 1103 10/02/13 1504 10/02/13 1913 10/02/13 2334 10/03/13 0334  GLUCAP 107* 118* 112* 89 95 85   CXR:  1/26  >>>> Hardware in good position, bilateral pleural effusions, bibasilar effusions rt greater left  ASSESSMENT / PLAN:  PULMONARY A: Acute Respiratory failure CAP LUL pulmonary nodule w/ mediastinal lymphadenopathy B/L Pleural Effusions R/o VAP P:   -Continue full vent support. -Cont VAP bundle -no SBT as worsening vent needs, abg reviewed, increase peep, fio2, reduce MV -Cont BDs -Mucomyst nebs BID (started 1/23)- consider dc  - Diuresis most important -doubt thora would change outcome / weaning - CXR tomorrow am -assess repeat sputum, see ID  CARDIOVASCULAR A:  H/o CAD, Takotsubo CMP. EF 50-55% on ECHO H/o AV block, s/p PPM placement Hypotension likely 2/2 septic shock d/t CAP. Resolved P:   -Metoprolol 25 BID   -Continue ASA, Lipitor -lasix  RENAL A:   AKI. Resolved.  Hypernatremia, slight increase BUN P:   -bmet in am  - lasix 40 IV once today, repeat -may be maxed on lasix soon, follow BUN, NA -start free water flushes 200 q8 - k sup  GASTROINTESTINAL A:   GI PPx. Nutrition.  P:   -Tube feeds per nutrition -Protonix 40 qd  HEMATOLOGIC A:   DVT PPx. Leukocytosis. Improved Anemia- stable P:  -Lovenox -Trend CBC  INFECTIOUS A:   CAP. UTI Septic shock; resolved Fever 1/25 at risk VAP with vent increase needs P:   -allow zosyn to dc -UA -follow BC -with vent increases, consider addition VAP coverage, add ceftaz, vanc -send sputum  ENDOCRINE A:   No h/o DM P:   -SSI -CBG's q4h - Lantus 10 units,  maintain  NEUROLOGIC A:   Agitation H/o depression/anxiety P:   - fentanyl, versed  for pain/sedation -80 years old, high high high risk delirium, dc versed - Hold Effexor -Neurontin   Family meeting for goals of care planned for this afternoon.  Joni Reining, DO PGY1 Pgr. [phone]  10/03/2013, 6:47 AM Ccm time 30 min   I have fully examined this patient and agree with above findings.    And edited in full  Ccm time 35 min , excluding meeting  Lavon Paganini. Titus Mould, MD, [place] Pgr: [facility]

## 2013-10-04 LAB — CBC
HEMATOCRIT: 36.4 % (ref 36.0–46.0)
HEMOGLOBIN: 11.2 g/dL — AB (ref 12.0–15.0)
MCH: 32.3 pg (ref 26.0–34.0)
MCHC: 30.8 g/dL (ref 30.0–36.0)
MCV: 104.9 fL — ABNORMAL HIGH (ref 78.0–100.0)
Platelets: 431 10*3/uL — ABNORMAL HIGH (ref 150–400)
RBC: 3.47 MIL/uL — AB (ref 3.87–5.11)
RDW: 16.5 % — ABNORMAL HIGH (ref 11.5–15.5)
WBC: 18.6 10*3/uL — ABNORMAL HIGH (ref 4.0–10.5)

## 2013-10-04 LAB — GLUCOSE, CAPILLARY
GLUCOSE-CAPILLARY: 167 mg/dL — AB (ref 70–99)
GLUCOSE-CAPILLARY: 140 mg/dL — AB (ref 70–99)
GLUCOSE-CAPILLARY: 163 mg/dL — AB (ref 70–99)
Glucose-Capillary: 167 mg/dL — ABNORMAL HIGH (ref 70–99)

## 2013-10-04 LAB — BASIC METABOLIC PANEL
BUN: 57 mg/dL — AB (ref 6–23)
CHLORIDE: 103 meq/L (ref 96–112)
CO2: 31 mEq/L (ref 19–32)
Calcium: 8.5 mg/dL (ref 8.4–10.5)
Creatinine, Ser: 1.52 mg/dL — ABNORMAL HIGH (ref 0.50–1.10)
GFR calc non Af Amer: 30 mL/min — ABNORMAL LOW (ref >90)
GFR, EST AFRICAN AMERICAN: 35 mL/min — AB (ref >90)
Glucose, Bld: 180 mg/dL — ABNORMAL HIGH (ref 70–99)
POTASSIUM: 4.4 meq/L (ref 3.7–5.3)
SODIUM: 146 meq/L (ref 137–147)

## 2013-10-04 LAB — VANCOMYCIN, TROUGH: Vancomycin Tr: 18.4 ug/mL (ref 10.0–20.0)

## 2013-10-04 MED ORDER — VANCOMYCIN HCL IN DEXTROSE 750-5 MG/150ML-% IV SOLN
750.0000 mg | INTRAVENOUS | Status: DC
  Administered 2013-10-04: 750 mg via INTRAVENOUS
  Filled 2013-10-04: qty 150

## 2013-10-04 MED ORDER — MIDAZOLAM BOLUS VIA INFUSION
5.0000 mg | INTRAVENOUS | Status: DC | PRN
  Filled 2013-10-04: qty 20

## 2013-10-04 MED ORDER — MORPHINE SULFATE 10 MG/ML IJ SOLN
10.0000 mg/h | INTRAVENOUS | Status: DC
  Administered 2013-10-04: 25 mg/h via INTRAVENOUS
  Filled 2013-10-04 (×2): qty 10

## 2013-10-04 MED ORDER — MORPHINE BOLUS VIA INFUSION
5.0000 mg | INTRAVENOUS | Status: DC | PRN
  Filled 2013-10-04: qty 20

## 2013-10-04 MED ORDER — ENOXAPARIN SODIUM 30 MG/0.3ML ~~LOC~~ SOLN
30.0000 mg | Freq: Every day | SUBCUTANEOUS | Status: DC
  Administered 2013-10-04: 30 mg via SUBCUTANEOUS
  Filled 2013-10-04: qty 0.3

## 2013-10-04 MED ORDER — SODIUM CHLORIDE 0.9 % IV SOLN
10.0000 mg/h | INTRAVENOUS | Status: DC
  Administered 2013-10-04: 2 mg/h via INTRAVENOUS
  Filled 2013-10-04: qty 10

## 2013-10-04 MED ORDER — HEPARIN SODIUM (PORCINE) 5000 UNIT/ML IJ SOLN
5000.0000 [IU] | Freq: Three times a day (TID) | INTRAMUSCULAR | Status: DC
  Filled 2013-10-04: qty 1

## 2013-10-04 MED ORDER — FREE WATER: 200.0000 mL | Freq: Three times a day (TID) | Status: DC

## 2013-10-04 MED ORDER — DEXTROSE 5 % IV SOLN
1.0000 g | INTRAVENOUS | Status: DC
  Filled 2013-10-04: qty 1

## 2013-10-06 ENCOUNTER — Ambulatory Visit (INDEPENDENT_AMBULATORY_CARE_PROVIDER_SITE_OTHER): Payer: Self-pay | Admitting: Ophthalmology

## 2013-10-08 LAB — CULTURE, BLOOD (ROUTINE X 2)
Culture: NO GROWTH
Culture: NO GROWTH

## 2013-10-09 NOTE — Progress Notes (Signed)
Extensive discussion with family. We discussed the poor prognosis and likely poor quality of life. Family has decided to offer full comfort care. They are aware that the patient may be transferred to palliative care floor for continued comfort care needs. They have been fully updated on the process and expectations. Lavon Paganini. Titus Mould, MD, [place] Pgr: [facility]

## 2013-10-09 NOTE — Progress Notes (Signed)
Pt had their own pastor present.  Chaplain educated family on resources the chaplains office provides and left them in the care of their own spiritual resource.

## 2013-10-09 NOTE — Progress Notes (Signed)
1600 Fentanyl drip 200 mcg IV wasted in sink by Ricki Rodriguez, RN. Witnessed by Derrel Nip, RN.

## 2013-10-09 NOTE — Procedures (Signed)
Extubation Procedure Note  Patient Details:   Name: [name] DOB: Jul 01, 1927 MRN: [id number]   Airway Documentation:  Airway 8 mm (Active)  Secured at (cm) 24 cm 2013/11/03  3:18 PM  Measured From Lips 11-03-13  3:18 PM  [place] 2013-11-03  3:18 PM  Secured By [employer] November 03, 2013  3:18 PM  Tube Holder Repositioned Yes 2013-11-03  3:18 PM  Cuff Pressure (cm H2O) 22 cm H2O 03-Nov-2013  8:51 AM  Site Condition Dry 2013-11-03  3:18 PM    Evaluation  O2 sats: stable throughout Complications: No apparent complications Patient did tolerate procedure well. Bilateral Breath Sounds: Diminished;Clear (coarse) Suctioning: Airway;Oral No  Chriss Driver [facility] Nov 03, 2013, 7:29 PM

## 2013-10-09 NOTE — Progress Notes (Signed)
Attempting to wean fio2/peep per sat goal order. Peep weaned to 68, Sats 94% presently.

## 2013-10-09 NOTE — Progress Notes (Signed)
Pt DNR, comfort care, extubated at 1929. Now asystole on monitor, no heart sounds or breath sounds present. Family at bedside.

## 2013-10-09 NOTE — Progress Notes (Signed)
20 ml of Versed gtt wasted in sink. Witnessed by Dell Ponto RN.

## 2013-10-09 NOTE — Progress Notes (Signed)
ANTIBIOTIC CONSULT NOTE - INITIAL  Pharmacy Consult for Vancomycin and Ceftazidime Indication: r/o VAP  Allergies  Allergen Reactions  . Codeine Other (See Comments)    Unsure of reaction    Patient Measurements: Height: 5\' 4"  (162.6 cm) Weight: 161 lb 13.1 oz (73.4 kg) IBW/kg (Calculated) : 54.7  Vital Signs: Temp: 99.6 F (37.6 C) (01/27 0743) Temp src: Oral (01/27 0743) BP: 123/58 mmHg (01/27 1050) Pulse Rate: 103 (01/27 1050) Intake/Output from previous day: 01/26 0701 - 01/27 0700 In: 3061.2 [I.V.:1071.5; NG/GT:1589.7; IV Piggyback:400] Out: 1380 [Urine:630; Stool:750] Intake/Output from this shift: Total I/O In: 140 [I.V.:40; NG/GT:100] Out: 0   Labs:  Recent Labs  10/02/13 0400 10/03/13 0500 Oct 11, 2013 0500  WBC 19.4* 17.2* 18.6*  HGB 10.4* 11.4* 11.2*  PLT 374 450* 431*  CREATININE 0.82 0.82 1.52*   Estimated Creatinine Clearance: 27.1 ml/min (by C-G formula based on Cr of 1.52). No results found for this basename: VANCOTROUGH, Corlis Leak, VANCORANDOM, [place], Gann, [place], [place], [place], TOBRARND, AMIKACINPEAK, AMIKACINTROU, AMIKACIN,  in the last 72 hours   Microbiology: Recent Results (from the past 720 hour(s))  URINE CULTURE     Status: None   Collection Time    09/13/2013 11:05 PM      Result Value Range Status   Specimen Description URINE, CLEAN CATCH   Final   Special Requests NONE   Final   Culture  Setup Time     Final   Value: 09/21/2013 06:13     Performed at [place]     Final   Value: 10,000 COLONIES/ML     Performed at [facility]   Culture     Final   Value: PSEUDOMONAS AERUGINOSA     Performed at [facility]   Report Status 09/24/2013 FINAL   Final   Organism ID, Bacteria PSEUDOMONAS AERUGINOSA   Final  CULTURE, BLOOD (ROUTINE X 2)     Status: None   Collection Time    09/21/13 12:15 AM      Result Value Range Status   Specimen Description BLOOD LEFT ANTECUBITAL    Final   Special Requests     Final   Value: BOTTLES DRAWN AEROBIC AND ANAEROBIC AER 5cc ANA 5cc   Culture  Setup Time     Final   Value: 09/21/2013 02:20     Performed at [facility]   Culture     Final   Value: NO GROWTH 5 DAYS     Performed at [facility]   Report Status 09/27/2013 FINAL   Final  CULTURE, BLOOD (ROUTINE X 2)     Status: None   Collection Time    09/21/13 12:15 AM      Result Value Range Status   Specimen Description BLOOD RIGHT ANTECUBITAL   Final   Special Requests     Final   Value: BOTTLES DRAWN AEROBIC AND ANAEROBIC AER 5cc ANA 5cc   Culture  Setup Time     Final   Value: 09/21/2013 02:20     Performed at [facility]   Culture     Final   Value: NO GROWTH 5 DAYS     Performed at [facility]   Report Status 09/27/2013 FINAL   Final  CULTURE, BLOOD (ROUTINE X 2)     Status: None   Collection Time    09/21/13  6:10 AM      Result Value Range Status  Specimen Description BLOOD RIGHT ARM   Final   Special Requests BOTTLES DRAWN AEROBIC AND ANAEROBIC 10CC EACH   Final   Culture  Setup Time     Final   Value: 09/21/2013 12:14     Performed at [facility]   Culture     Final   Value: NO GROWTH 5 DAYS     Performed at [facility]   Report Status 09/27/2013 FINAL   Final  CULTURE, BLOOD (ROUTINE X 2)     Status: None   Collection Time    09/21/13  6:18 AM      Result Value Range Status   Specimen Description BLOOD RIGHT ARM   Final   Special Requests BOTTLES DRAWN AEROBIC AND ANAEROBIC 10CC EACH   Final   Culture  Setup Time     Final   Value: 09/21/2013 12:14     Performed at [facility]   Culture     Final   Value: NO GROWTH 5 DAYS     Performed at [facility]   Report Status 09/27/2013 FINAL   Final  MRSA PCR SCREENING     Status: None   Collection Time    09/25/13  7:29 PM      Result Value Range Status   MRSA by PCR NEGATIVE  NEGATIVE Final   Comment:            The  GeneXpert MRSA Assay (FDA     approved for NASAL specimens     only), is one component of a     comprehensive MRSA colonization     surveillance program. It is not     intended to diagnose MRSA     infection nor to guide or     monitor treatment for     MRSA infections.  CULTURE, BLOOD (ROUTINE X 2)     Status: None   Collection Time    09/26/13 12:30 AM      Result Value Range Status   Specimen Description BLOOD CENTRAL LINE   Final   Special Requests BOTTLES DRAWN AEROBIC AND ANAEROBIC 5CC EA   Final   Culture  Setup Time     Final   Value: 09/26/2013 08:51     Performed at [facility]   Culture     Final   Value: NO GROWTH 5 DAYS     Performed at [facility]   Report Status 10/02/2013 FINAL   Final  CULTURE, BLOOD (ROUTINE X 2)     Status: None   Collection Time    09/26/13  7:15 AM      Result Value Range Status   Specimen Description BLOOD RIGHT ARM   Final   Special Requests BOTTLES DRAWN AEROBIC ONLY 5CC   Final   Culture  Setup Time     Final   Value: 09/26/2013 12:49     Performed at [facility]   Culture     Final   Value: NO GROWTH 5 DAYS     Performed at [facility]   Report Status 10/02/2013 FINAL   Final  AFB CULTURE WITH SMEAR     Status: None   Collection Time    09/28/13 12:28 PM      Result Value Range Status   Specimen Description BRONCHIAL ALVEOLAR LAVAGE   Final   Special Requests NONE   Final   ACID FAST SMEAR     Final  Value: NO ACID FAST BACILLI SEEN     Performed at [facility]   Culture     Final   Value: CULTURE WILL BE EXAMINED FOR 6 WEEKS BEFORE ISSUING A FINAL REPORT     Performed at [facility]   Report Status PENDING   Incomplete  CULTURE, BAL-QUANTITATIVE     Status: None   Collection Time    09/28/13 12:28 PM      Result Value Range Status   Specimen Description BRONCHIAL ALVEOLAR LAVAGE   Final   Special Requests NONE   Final   Gram Stain     Final   Value: RARE WBC  PRESENT,BOTH PMN AND MONONUCLEAR     NO SQUAMOUS EPITHELIAL CELLS SEEN     NO ORGANISMS SEEN     Performed at [facility]     Final   Value: 50,000 COLONIES/ML     Performed at [facility]   Culture     Final   Value: CANDIDA ALBICANS     Performed at [facility]   Report Status 09/30/2013 FINAL   Final  CULTURE, RESPIRATORY (NON-EXPECTORATED)     Status: None   Collection Time    09/28/13 12:29 PM      Result Value Range Status   Specimen Description BRONCHIAL ALVEOLAR LAVAGE   Final   Special Requests NONE   Final   Gram Stain     Final   Value: RARE WBC PRESENT,BOTH PMN AND MONONUCLEAR     NO SQUAMOUS EPITHELIAL CELLS SEEN     NO ORGANISMS SEEN     Performed at [facility]   Culture     Final   Value: FEW CANDIDA ALBICANS     Performed at [facility]   Report Status 09/30/2013 FINAL   Final  CULTURE, BLOOD (ROUTINE X 2)     Status: None   Collection Time    10/02/13  9:00 AM      Result Value Range Status   Specimen Description BLOOD RIGHT ARM   Final   Special Requests BOTTLES DRAWN AEROBIC AND ANAEROBIC 5CC EACH   Final   Culture  Setup Time     Final   Value: 10/02/2013 15:59     Performed at [facility]   Culture     Final   Value:        BLOOD CULTURE RECEIVED NO GROWTH TO DATE CULTURE WILL BE HELD FOR 5 DAYS BEFORE ISSUING A FINAL NEGATIVE REPORT     Performed at [facility]   Report Status PENDING   Incomplete  CULTURE, BLOOD (ROUTINE X 2)     Status: None   Collection Time    10/02/13  9:15 AM      Result Value Range Status   Specimen Description BLOOD RIGHT HAND   Final   Special Requests BOTTLES DRAWN AEROBIC ONLY 10CC   Final   Culture  Setup Time     Final   Value: 10/02/2013 15:59     Performed at [facility]   Culture     Final   Value:        BLOOD CULTURE RECEIVED NO GROWTH TO DATE CULTURE WILL BE HELD FOR 5 DAYS BEFORE ISSUING A FINAL NEGATIVE REPORT      Performed at [facility]   Report Status PENDING   Incomplete  URINE CULTURE     Status:  None   Collection Time    10/02/13  9:50 AM      Result Value Range Status   Specimen Description URINE, CATHETERIZED   Final   Special Requests NONE   Final   Culture  Setup Time     Final   Value: 10/02/2013 17:00     Performed at [facility]     Final   Value: NO GROWTH     Performed at [facility]   Culture     Final   Value: NO GROWTH     Performed at [facility]   Report Status 10/03/2013 FINAL   Final  CLOSTRIDIUM DIFFICILE BY PCR     Status: None   Collection Time    10/02/13  1:58 PM      Result Value Range Status   C difficile by pcr NEGATIVE  NEGATIVE Final    Medical History: Past Medical History  Diagnosis Date  . Hemorrhoids   . Hypertension   . Anemia   . Diverticulitis   . Hyperlipidemia   . Depression   . Anxiety   . CAD (coronary artery disease)      a. NSTEMI 7/12 with cath: dLM 20%, mLAD 60%, dLAD 30%, Dx 40%, mCFX at AV groove 30%, prox to mid RCA 30-40%, PDA 50%, EF 35% with apical ballooning and DK of the apex;      . Takotsubo cardiomyopathy     a. echo 7/12:  EF 35%, inf-sep, AS and inf HK, mild LVH, mild MR;  b. echo 10/12: EF 60%, mild LAE  . Syncope     2/2 mobitz 2  . Mobitz type 2 second degree atrioventricular block   . LBBB (left bundle branch block)   . Biventricular cardiac pacemaker in situ     [employer]    Medications:  Scheduled:  . albuterol  2.5 mg Nebulization Q6H  . antiseptic oral rinse  15 mL Mouth Rinse QID  . aspirin  81 mg Oral Daily  . atorvastatin  5 mg Oral Daily  . cefTAZidime (FORTAZ)  IV  1 g Intravenous Q24H  . chlorhexidine  15 mL Mouth Rinse BID  . gabapentin  200 mg Oral Q8H  . [START ON 10/05/2013] heparin subcutaneous  5,000 Units Subcutaneous Q8H  . insulin aspart  0-9 Units Subcutaneous Q4H  . insulin glargine  10 Units Subcutaneous Daily  . lidocaine  10 mL Other  Once  . metoprolol tartrate  25 mg Per Tube Q12H  . pantoprazole sodium  40 mg Per Tube Daily  . polyethylene glycol  17 g Per Tube Daily   Assessment: 80 yo F presented initially w/ CAP received 8 days of Zosyn. Patient was intubated on 1/18, now with persistent fevers and elevated WBC. Patient's renal function has increased from 0.82 to 1.52.  Her VT returned therapeutic at 18.4 mcg/mL, however patient's renal function may continue to decline.   1/25 Cdiff >> neg 1/25 Urine >> neg 1/25 BCx>> ngtd 1/21 AFB >> neg (few candida) 1/21 BAL >> 50K candida albicans 1/19 Bld x2>> neg 1/13 Urine>> pseudomonas (pan S) 1/14 Bld x 4>>neg 1/14 Flu neg  Goal of Therapy:  Vancomycin trough level 15-20 mcg/ml  Plan:  - Adjust Vancomycin to 750mg  IV q24h - Adjust Ceftazdime to 1g IV q24h - Monitor clinical progression, and cultures - Evaluate renal function tomorrow, adjust dose accordingly  Angelica Chessman 2013/11/03,11:05 AM

## 2013-10-09 NOTE — Progress Notes (Signed)
PULMONARY/CRITICAL CARE MEDICINE   Name: [name] MRN: [id number] DOB: [birth year]    ADMISSION DATE:  09/08/2013 CONSULTATION DATE:  09/25/13  REFERRING MD :  Grandville Silos PRIMARY SERVICE: PCCM  CHIEF COMPLAINT: Abnormal CT, pneumonia, hypoxia  BRIEF PATIENT DESCRIPTION: 80 y/o female never smoker with PMHx of CAD, takotsubo cardiomyopathy, HTN, AV block s/p PPM placement, presented 1/14 with 4 day history of cough, congestion, fever, malaise. She was admitted and also found to have UTI. Treated initially with rocephin, azithro and empiric tamiflu (although flu neg), switched to Levaquin. Patient continued to have hypoxia, CT chest showed LUL pulm nodule and PCCM consulted.   SIGNIFICANT EVENTS / STUDIES:  1/17 CT chest:  No PE, peribronchovascular soft tissue thickening extending from the left and right hilum into the lower lobes, Left upper lobe pulmonary nodule and mediastinal lymphadenopathy, BLL consolidation.  1/21 FOB: No masses noted. BI, RLL, RML airways markedly edematous and erythematous. Resp cx, AFB, cytology on washings 1/25 Recurrence of fever 1/26- NCB no escalation established, no trach option 1/26- fevers, worsening vent and BP  LINES / TUBES: ETT 1/19 >>> OGT 1/19 >>>1/24, 1/24>> RIJ CVL 1/19 >>>   CULTURES: Urine 1/13>>> pseudomonas (pan sens)  BCx2 1/14>>> Neg Urine legionella 1/14>>> NEG  Urine strep 1/14>>> NEG  Flu PCR 1/14>> NEG  MRSA 1/18 >>> NEG Blood 1/19 >> neg Resp (bronch) 1/21 >> candida 50K AFB (bronch) 1/21 >> neg prelim C diff 1/25 >>>neg Urine 1/25 >>> neg Blood 1/25 >>> NGTD  ANTIBIOTICS: Azithro 1/14 >>> 1/14 Rocephin 1/14 >>> 1/14 Levaquin 1/16 >>>1/18 Zosyn 1/18 >>>1/16 Vancomycin 1/18 >>>1/23, 1/26 >>> Tamiflu 1/15 >>> 1/19 Ceftazidime 1/26 >>>  SUBJECTIVE: Patient still requiring significant ventilator support.  Patiently hypotensive overnight, continues to spike fevers.   VITAL SIGNS: Temp:  [100.2 F (37.9  C)-102.9 F (39.4 C)] 100.2 F (37.9 C) (01/27 0415) Pulse Rate:  [80-109] 99 (01/27 0600) Resp:  [12-22] 15 (01/27 0600) BP: (70-126)/(38-53) 98/45 mmHg (01/27 0600) SpO2:  [81 %-95 %] 95 % (01/27 0600) FiO2 (%):  [50 %-70 %] 70 % (01/27 0600) Weight:  [161 lb 13.1 oz (73.4 kg)] 161 lb 13.1 oz (73.4 kg) (01/27 0500)  HEMODYNAMICS:    VENTILATOR SETTINGS: Vent Mode:  [-] PRVC FiO2 (%):  [50 %-70 %] 70 % Set Rate:  [12 bmp] 12 bmp Vt Set:  [430 mL] 430 mL PEEP:  [10 cmH20-12 cmH20] 12 cmH20 Plateau Pressure:  [22 cmH20-26 cmH20] 25 cmH20  INTAKE / OUTPUT: Intake/Output     01/26 0701 - 01/27 0700   I.V. (mL/kg) 1026.5 (14)   NG/GT 1539.7   IV Piggyback 400   Total Intake(mL/kg) 2966.2 (40.4)   Urine (mL/kg/hr) 630 (0.4)   Stool 750 (0.4)   Total Output 1380   Net +1586.2         PHYSICAL EXAMINATION:  General: lying in bed, intubated, sedated Neuro: Sedated RASS -3 HEENT: ETT in place Lungs: mechanical breath sounds bilaterally, no wheezing, decreased bibasilar breath sounds Heart: regular rate and rhythm, no murmurs, gallops, or rubs Abdomen: soft, non-tender, non-distended, normal bowel sounds Extremities: 1+ pedal edema to knee bilaterally  LABS:  CBC  Recent Labs Lab 10/02/13 0400 10/03/13 0500 09/15/2013 0500  WBC 19.4* 17.2* 18.6*  HGB 10.4* 11.4* 11.2*  HCT 32.4* 36.3 36.4  PLT 374 450* 431*   Coag's No results found for this basename: APTT, INR,  in the last 168 hours BMET  Recent Labs Lab 10/02/13 0400 10/03/13  0500 2013-10-05 0500  NA 149* 149* 146  K 4.1 3.9 4.4  CL 106 103 103  CO2 33* 33* 31  BUN 36* 36* 57*  CREATININE 0.82 0.82 1.52*  GLUCOSE 117* 103* 180*   Electrolytes  Recent Labs Lab 09/29/13 0243 09/29/13 0500  10/02/13 0400 10/03/13 0500 10-05-13 0500  CALCIUM 8.5  --   < > 9.1 9.1 8.5  MG  --  2.2  --   --   --   --   < > = values in this interval not displayed. Sepsis Markers  Recent Labs Lab 09/28/13 0500   PROCALCITON 1.70   ABG  Recent Labs Lab 09/30/13 0816 10/03/13 0836  PHART 7.457* 7.442  PCO2ART 40.6 52.3*  PO2ART 61.0* 54.0*   Liver Enzymes  Recent Labs Lab 09/28/13 0500  AST 46*  ALT 42*  ALKPHOS 68  BILITOT 0.2*  ALBUMIN 1.5*   Cardiac Enzymes  Recent Labs Lab 09/29/13 0535 10/01/13 0830  TROPONINI <0.30  --   PROBNP  --  1790.0*   Glucose  Recent Labs Lab 10/03/13 0925 10/03/13 1228 10/03/13 1606 10/03/13 1858 10/03/13 2351 2013-10-05 0414  GLUCAP 145* 169* 151* 142* 167* 167*   CXR:  Continued unchanged bilateral effusions / atx / infiltrate, ett wnl  ASSESSMENT / PLAN:  PULMONARY A: Acute Respiratory failure CAP LUL pulmonary nodule w/ mediastinal lymphadenopathy B/L Pleural Effusions R/o VAP P:   -Continue full vent support, peep 10 70%, increase peep to 12 if higher Fio2 required -Cont VAP bundle -no SBT as worsening vent needs -Cont BDs - Diuresis most important if BP allows  -doubt thora would change outcome / weaning -assess repeat sputum, see ID  CARDIOVASCULAR A:  H/o CAD, Takotsubo CMP. EF 50-55% on ECHO H/o AV block, s/p PPM placement Hypotension likely 2/2 septic shock d/t CAP. Resolved, improved 1/27 P:   -Metoprolol 25 BID   -Continue ASA, Lipitor -consider lasix in drip in future if required, see renal  RENAL A:  ARF Contraction Alkalosis due to diuresis prior?? Hypernatremia resolved Hypervolemia P:   -bmet in am  -hold lasix further with crt rise D/C free water flushes 200 q8  GASTROINTESTINAL A:   GI PPx. Nutrition.  P:   -Tube feeds per nutrition -Protonix 40 qd  HEMATOLOGIC A:   DVT PPx. Leukocytosis. Anemia- stable P:  -Lovenox change to sub q heparin with acute renal failure -Trend CBC  INFECTIOUS A:   CAP. UTI Septic shock; resolved Fever 1/25 at risk VAP with vent increase needs, NO changes in sputum at all P:   -Continue Vanc, Ceftazidime -follow BC -send sputum, needs to be  collected  ENDOCRINE A:   No h/o DM P:   -SSI -CBG's q4h - Lantus 10 units,  maintain  NEUROLOGIC A:   Agitation H/o depression/anxiety P:   - fentanyl for pain/sedation -avoiding versed - Hold Effexor -Neurontin    No progress, will update family, consider dc support today Joni Reining, DO PGY1 Pgr. [phone]  05-Oct-2013, 6:40 AM  Ccm time 30 min   I have fully examined this patient and agree with above findings.    And edited in full  Lavon Paganini. Titus Mould, MD, [place] Pgr: [facility]

## 2013-10-09 NOTE — Progress Notes (Signed)
Followed up with pt's granddaughter, sister-in-law, and niece. Family discussed suddenness of pt's illness, expressed still feeling "shocked, stunned, and confused." Pt's granddaughter updates other family members on yesterday's goals of care discussion, saying they plan with "remove the ventilator" if pt shows no improvement by tomorrow, 1/28. She said that pt "would not want to be a burden, would not want this, is very independent." She was concerned about her children, pt's great grandchildren, who visited Saturday and were very upset to see pt on vent and in ICU.   Chaplain listened empathically to their concerns and verbal processing, providing a caring presence and emotional and spiritual support. Will follow up.   Please page if needed.   [facility]: [phone]

## 2013-10-09 DEATH — deceased

## 2013-10-19 NOTE — Discharge Summary (Signed)
Lori, [name] NO.:  [id number]  MEDICAL RECORD NO.:  [id number]  LOCATION:  2M04C                        FACILITY:  [facility]  PHYSICIAN:  Raylene Miyamoto, MD DATE OF BIRTH:  09-30-1926  DATE OF ADMISSION:  09/09/2013 DATE OF DISCHARGE:  02-Nov-2013                              DISCHARGE SUMMARY   DEATH SUMMARY  This is an unfortunate 80 year old female, nonsmoker with past medical history of coronary artery disease, Takotsubo cardiomyopathy, hypertension, AV block status post pacemaker who presented on January 14 with a four-day history of cough, congestion, fever, and malaise.  She was also found to have UTI.  She was treated initially with Rocephin and azithromycin, empiric Tamiflu and switched to Levaquin.  The patient continued to have hypoxia.  CT scan of the chest showed left upper lobe pulmonary nodule concerning for malignancy and PCCM was consulted. Significant events over the patient's hospitalization included on January 17 showed CT of the chest showed no PE, but a peribronchial vascular soft tissue thickening extending from the left and right hilum into the lower lobe, left upper lobe, pulmonary nodule, and a mediastinal lymphadenopathy with bilateral lobe consolidation.  The patient is on the ventilator machine and underwent bronchoscopy with no masses noted in the bronchus intermedius, in the right lower lobe, right middle lobe.  Airways were markedly edematous and erythematous. Respiratory cultures for an AFB and cytology washings were sent.  The patient had continued fevers on January 25.  No code blue was established because of lack of progress on January 26 in terms of cancer.  No trach option.  On January 26, the patient continued have fevers, worsening ventilator and blood pressure need for 2 septic shock. Family discussions were had.  To note, the patient had urine culture of pseudomonas, respiratory bronch culture with 50,000  candida likely a nonpathogen.  The patient was given aggressive antibiotics including ceftazidime, vancomycin with lack of progress in fact worsening ventilator needs with worsening pneumonia __________ VAP.  The patient's family decided that she would not want to have continued support under these circumstances with her prior functional status, and now worsening ventilatory needs in the setting of undiagnosed but concerning lymphadenopathy and mediastinal mass concerning for cancer.  We were crystal clear and explain to the family that we do not have a pathologic diagnosis, she was doing very poorly from a multiorgan failure perspective and comfort care was decided upon the patient expired.  FINAL DIAGNOSES UPON DEATH: 1. Pneumonia. 2. 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 3. Bilateral pleural effusion. 4. Rule out ventilator-associated pneumonia. 5. Acute respiratory failure requiring vent-dependent respiratory     failure. 6. Septic shock. 7. Acute renal failure.     Raylene Miyamoto, MD     DJF/MEDQ  D:  10/18/2013  T:  10/19/2013  Job:  [id number]

## 2013-11-10 LAB — AFB CULTURE WITH SMEAR (NOT AT ARMC): ACID FAST SMEAR: NONE SEEN
